# Patient Record
Sex: Male | Born: 1975 | Race: White | Hispanic: No | Marital: Single | Smoking: Current every day smoker
Health system: Southern US, Community
[De-identification: ages and names within clinical notes are randomized; demographics above are authoritative.]

## PROBLEM LIST (undated history)

## (undated) DIAGNOSIS — K219 Gastro-esophageal reflux disease without esophagitis: Secondary | ICD-10-CM

## (undated) DIAGNOSIS — Z87442 Personal history of urinary calculi: Secondary | ICD-10-CM

## (undated) DIAGNOSIS — K8020 Calculus of gallbladder without cholecystitis without obstruction: Secondary | ICD-10-CM

## (undated) HISTORY — DX: Calculus of gallbladder without cholecystitis without obstruction: K80.20

## (undated) HISTORY — PX: WISDOM TOOTH EXTRACTION: SHX21

---

## 2000-12-05 ENCOUNTER — Emergency Department (HOSPITAL_COMMUNITY): Admission: EM | Admit: 2000-12-05 | Discharge: 2000-12-05 | Payer: Self-pay | Admitting: Emergency Medicine

## 2004-07-16 ENCOUNTER — Ambulatory Visit: Payer: Self-pay | Admitting: Internal Medicine

## 2013-02-09 ENCOUNTER — Encounter: Payer: Self-pay | Admitting: Family Medicine

## 2013-02-09 ENCOUNTER — Ambulatory Visit (INDEPENDENT_AMBULATORY_CARE_PROVIDER_SITE_OTHER): Payer: Self-pay | Admitting: Family Medicine

## 2013-02-09 VITALS — BP 138/96 | HR 64 | Temp 98.1°F | Wt 169.2 lb

## 2013-02-09 DIAGNOSIS — B9689 Other specified bacterial agents as the cause of diseases classified elsewhere: Secondary | ICD-10-CM | POA: Insufficient documentation

## 2013-02-09 DIAGNOSIS — J019 Acute sinusitis, unspecified: Secondary | ICD-10-CM

## 2013-02-09 MED ORDER — DOXYCYCLINE HYCLATE 100 MG PO CAPS: 100.0000 mg | ORAL_CAPSULE | Freq: Two times a day (BID) | ORAL | Status: DC

## 2013-02-09 NOTE — Patient Instructions (Addendum)
Let's keep an eye on blood pressure at local pharmacy - if persistently elevated > 140/90, return to see me. Cancel appointment to re establish up front. You have a sinus infection. Take medicine as prescribed: doxycycline 10d course Push fluids and plenty of rest. Ibuprofen will help sinus pain Nasal saline irrigation or neti pot to help drain sinuses. May use plain mucinex (or immediate release guaifenesin) with plenty of fluid to help mobilize mucous. Let us know if fever >101.5, trouble opening/closing mouth, difficulty swallowing, or worsening - you may need to be seen again.

## 2013-02-09 NOTE — Progress Notes (Signed)
  Subjective:    Patient ID: Skip Mayer, male    DOB: 1975/12/04, 37 y.o.   MRN: [id number]  HPI CC: "i think i have a sinus infection"  Left sinus pressure behind eye for the last 9 days, worsening over the last several days.  Blowing nose with green purulent mucous.  PNdrainage.  So far has tried ibuprofen for this.  Ex smoker - quit 01/31/2013.  No sick contacts at home. Smokers at home - smoke outside. No h/o asthma.  Not seen here in >5 yrs.  Medications and allergies reviewed and updated in chart.  Past histories reviewed and updated if relevant as below. There are no active problems to display for this patient.  History reviewed. No pertinent past medical history. History reviewed. No pertinent past surgical history. History  Substance Use Topics  . Smoking status: Former Games developer  . Smokeless tobacco: Never Used  . Alcohol Use: Yes     Comment: Occasional   Family History  Problem Relation Age of Onset  . Hyperlipidemia Father   . Heart disease Paternal Uncle     heart transplant  . Cancer Paternal Uncle     kidney  . Diabetes Neg Hx   . Stroke Neg Hx   . CAD Neg Hx    Allergies  Allergen Reactions  . Penicillins Anaphylaxis   No current outpatient prescriptions on file prior to visit.   No current facility-administered medications on file prior to visit.     Review of Systems No fevers/chills, abd pain, nausea, ear or tooth pain, coughing, ST.     Objective:   Physical Exam  Nursing note and vitals reviewed. Constitutional: He appears well-developed and well-nourished. No distress.  HENT:  Head: Normocephalic and atraumatic.  Right Ear: Hearing, tympanic membrane, external ear and ear canal normal.  Left Ear: Hearing, tympanic membrane, external ear and ear canal normal.  Nose: Mucosal edema present. No rhinorrhea. Right sinus exhibits no maxillary sinus tenderness and no frontal sinus tenderness. Left sinus exhibits maxillary sinus  tenderness and frontal sinus tenderness.  Mouth/Throat: Uvula is midline, oropharynx is clear and moist and mucous membranes are normal. No oropharyngeal exudate, posterior oropharyngeal edema, posterior oropharyngeal erythema or tonsillar abscesses.  Purulent nasal discharge out of left nare  Eyes: Conjunctivae and EOM are normal. Pupils are equal, round, and reactive to light. No scleral icterus.  Neck: Normal range of motion. Neck supple.  Cardiovascular: Normal rate, regular rhythm, normal heart sounds and intact distal pulses.   No murmur heard. Pulmonary/Chest: Effort normal and breath sounds normal. No respiratory distress. He has no wheezes. He has no rales.  Lymphadenopathy:    He has no cervical adenopathy.  Skin: Skin is warm and dry. No rash noted.       Assessment & Plan:

## 2013-02-09 NOTE — Assessment & Plan Note (Signed)
given duration and progression of sxs, anticipate bacterial. Treat with doxy 10 d course. Supportive care as per instructions. Update if sxs persist

## 2013-02-27 ENCOUNTER — Ambulatory Visit: Payer: Self-pay | Admitting: Family Medicine

## 2013-03-17 ENCOUNTER — Emergency Department: Payer: Self-pay | Admitting: Emergency Medicine

## 2013-03-17 LAB — URINALYSIS, COMPLETE
Glucose,UR: NEGATIVE mg/dL (ref 0–75)
Leukocyte Esterase: NEGATIVE
Ph: 5 (ref 4.5–8.0)
Protein: NEGATIVE
RBC,UR: 2 /HPF (ref 0–5)
Specific Gravity: 1.018 (ref 1.003–1.030)
Squamous Epithelial: NONE SEEN

## 2013-03-17 LAB — CBC
HCT: 45.7 % (ref 40.0–52.0)
HGB: 15.8 g/dL (ref 13.0–18.0)
Platelet: 319 10*3/uL (ref 150–440)
RDW: 13 % (ref 11.5–14.5)

## 2013-03-17 LAB — COMPREHENSIVE METABOLIC PANEL
Albumin: 4.6 g/dL (ref 3.4–5.0)
Anion Gap: 9 (ref 7–16)
Co2: 27 mmol/L (ref 21–32)
Creatinine: 1.43 mg/dL — ABNORMAL HIGH (ref 0.60–1.30)
EGFR (Non-African Amer.): >60
Osmolality: 283 (ref 275–301)
SGOT(AST): 30 U/L (ref 15–37)
Total Protein: 8.3 g/dL — ABNORMAL HIGH (ref 6.4–8.2)

## 2013-03-17 LAB — LIPASE, BLOOD: Lipase: 227 U/L (ref 73–393)

## 2013-07-19 ENCOUNTER — Other Ambulatory Visit: Payer: Self-pay

## 2018-04-10 ENCOUNTER — Emergency Department
Admission: EM | Admit: 2018-04-10 | Discharge: 2018-04-10 | Disposition: A | Discharge status: Home / Self Care | Payer: Self-pay | Attending: Emergency Medicine | Admitting: Emergency Medicine

## 2018-04-10 ENCOUNTER — Encounter: Payer: Self-pay | Admitting: Emergency Medicine

## 2018-04-10 ENCOUNTER — Emergency Department: Payer: Self-pay

## 2018-04-10 DIAGNOSIS — K8020 Calculus of gallbladder without cholecystitis without obstruction: Secondary | ICD-10-CM | POA: Insufficient documentation

## 2018-04-10 DIAGNOSIS — K8050 Calculus of bile duct without cholangitis or cholecystitis without obstruction: Secondary | ICD-10-CM

## 2018-04-10 DIAGNOSIS — Z87891 Personal history of nicotine dependence: Secondary | ICD-10-CM | POA: Insufficient documentation

## 2018-04-10 LAB — COMPREHENSIVE METABOLIC PANEL
ALK PHOS: 129 U/L — AB (ref 38–126)
ALT: 127 U/L — ABNORMAL HIGH (ref 0–44)
ANION GAP: 9 (ref 5–15)
AST: 82 U/L — ABNORMAL HIGH (ref 15–41)
Albumin: 4.4 g/dL (ref 3.5–5.0)
BUN: 18 mg/dL (ref 6–20)
CALCIUM: 9.4 mg/dL (ref 8.9–10.3)
CHLORIDE: 104 mmol/L (ref 98–111)
CO2: 27 mmol/L (ref 22–32)
Creatinine, Ser: 1.17 mg/dL (ref 0.61–1.24)
GFR calc non Af Amer: >60 mL/min (ref >60)
Glucose, Bld: 126 mg/dL — ABNORMAL HIGH (ref 70–99)
POTASSIUM: 5 mmol/L (ref 3.5–5.1)
SODIUM: 140 mmol/L (ref 135–145)
Total Bilirubin: 0.9 mg/dL (ref 0.3–1.2)
Total Protein: 8 g/dL (ref 6.5–8.1)

## 2018-04-10 LAB — URINALYSIS, COMPLETE (UACMP) WITH MICROSCOPIC
Bacteria, UA: NONE SEEN
Bilirubin Urine: NEGATIVE
GLUCOSE, UA: NEGATIVE mg/dL
Hgb urine dipstick: NEGATIVE
KETONES UR: NEGATIVE mg/dL
Leukocytes, UA: NEGATIVE
Nitrite: NEGATIVE
PH: 7 (ref 5.0–8.0)
Protein, ur: NEGATIVE mg/dL
SPECIFIC GRAVITY, URINE: 1.011 (ref 1.005–1.030)
Squamous Epithelial / LPF: NONE SEEN (ref 0–5)

## 2018-04-10 LAB — CBC
HEMATOCRIT: 42.8 % (ref 40.0–52.0)
HEMOGLOBIN: 15.4 g/dL (ref 13.0–18.0)
MCH: 34.4 pg — AB (ref 26.0–34.0)
MCHC: 35.9 g/dL (ref 32.0–36.0)
MCV: 95.9 fL (ref 80.0–100.0)
PLATELETS: 321 10*3/uL (ref 150–440)
RBC: 4.46 MIL/uL (ref 4.40–5.90)
RDW: 12.9 % (ref 11.5–14.5)
WBC: 9 10*3/uL (ref 3.8–10.6)

## 2018-04-10 LAB — TROPONIN I: Troponin I: <0.03 ng/mL (ref <0.03)

## 2018-04-10 LAB — LIPASE, BLOOD: LIPASE: 34 U/L (ref 11–51)

## 2018-04-10 MED ORDER — OXYCODONE-ACETAMINOPHEN 5-325 MG PO TABS: 1.0000 | ORAL_TABLET | ORAL | 0 refills | Status: DC | PRN

## 2018-04-10 MED ORDER — ONDANSETRON HCL 4 MG PO TABS: 4.0000 mg | ORAL_TABLET | Freq: Every day | ORAL | 0 refills | Status: DC | PRN

## 2018-04-10 NOTE — ED Triage Notes (Signed)
Pt arrives with complaints of upper abdominal & upper back pain.Pt states the pain feels like pressure/aching and is increased with palpation. Pain is intermittent and rates it at a 7 on 0-10 scale. Pt denies and n/v/d.

## 2018-04-10 NOTE — ED Triage Notes (Signed)
First Nurse Note: C/O upper abdominal pain radiating left toward mid back.  Patient is AAOx3.  Skin warm and dry.  Posture upright and relaxed. NAD

## 2018-04-10 NOTE — ED Provider Notes (Signed)
[facility] Emergency Department Provider Note ____________________________________________   First MD Initiated Contact with Patient 04/10/18 1227     (approximate)  I have reviewed the triage vital signs and the nursing notes.   HISTORY  Chief Complaint Abdominal Pain   HPI Walter Jefferson is a 42 y.o. male without any chronic medical conditions was presented to the emergency department today with upper abdominal as well as right upper abdominal pain over the past several weeks.  He says it is worse in the evening and feels like a cramp.  There is some radiation to the left side of his back.  Denies any nausea or vomiting but says that it does worsen after eating and with laying down at night.  Denies any diarrhea.  Denies abdominal surgeries.  Says that his father has had had of his gallbladder out.  Patient drinks but only occasionally.  History reviewed. No pertinent past medical history.  Patient Active Problem List   Diagnosis Date Noted  . Acute bacterial sinusitis 02/09/2013    History reviewed. No pertinent surgical history.  Prior to Admission medications   Medication Sig Start Date End Date Taking? Authorizing Provider  doxycycline (VIBRAMYCIN) 100 MG capsule Take 1 capsule (100 mg total) by mouth 2 (two) times daily. 02/09/13   Ria Bush, MD    Allergies Penicillins  Family History  Problem Relation Age of Onset  . Hyperlipidemia Father   . Heart disease Paternal Uncle        heart transplant  . Cancer Paternal Uncle        kidney  . Diabetes Neg Hx   . Stroke Neg Hx   . CAD Neg Hx     Social History Social History   Tobacco Use  . Smoking status: Former Research scientist (life sciences)  . Smokeless tobacco: Never Used  Substance Use Topics  . Alcohol use: Yes    Comment: Occasional  . Drug use: No    Review of Systems  Constitutional: No fever/chills Eyes: No visual changes. ENT: No sore throat. Cardiovascular: Denies chest  pain. Respiratory: Denies shortness of breath. Gastrointestinal:no nausea, no vomiting.  No diarrhea.  No constipation. Genitourinary: Negative for dysuria. Musculoskeletal: As above Skin: Negative for rash. Neurological: Negative for headaches, focal weakness or numbness.   ____________________________________________   PHYSICAL EXAM:  VITAL SIGNS: ED Triage Vitals  Enc Vitals Group     BP 04/10/18 1215 (!) 141/86     Pulse Rate 04/10/18 1215 91     Resp 04/10/18 1215 10     Temp 04/10/18 1215 98.2 F (36.8 C)     Temp Source 04/10/18 1215 Oral     SpO2 04/10/18 1215 100 %     Weight 04/10/18 1216 180 lb (81.6 kg)     Height 04/10/18 1216 5\' 10"  (1.778 m)     Head Circumference --      Peak Flow --      Pain Score 04/10/18 1215 7     Pain Loc --      Pain Edu? --      Excl. in [place]? --     Constitutional: Alert and oriented. Well appearing and in no acute distress. Eyes: Conjunctivae are normal.  Head: Atraumatic. Nose: No congestion/rhinnorhea. Mouth/Throat: Mucous membranes are moist.  Neck: No stridor.   Cardiovascular: Normal rate, regular rhythm. Grossly normal heart sounds.   Respiratory: Normal respiratory effort.  No retractions. Lungs CTAB. Gastrointestinal: Soft with epigastric as well as right upper  quadrant tenderness palpation with a negative Murphy sign. No distention. No CVA tenderness. Musculoskeletal: No lower extremity tenderness nor edema.  No joint effusions. Neurologic:  Normal speech and language. No gross focal neurologic deficits are appreciated. Skin:  Skin is warm, dry and intact. No rash noted. Psychiatric: Mood and affect are normal. Speech and behavior are normal.  ____________________________________________   LABS (all labs ordered are listed, but only abnormal results are displayed)  Labs Reviewed  COMPREHENSIVE METABOLIC PANEL - Abnormal; Notable for the following components:      Result Value   Glucose, Bld 126 (*)    AST 82  (*)    ALT 127 (*)    Alkaline Phosphatase 129 (*)    All other components within normal limits  CBC - Abnormal; Notable for the following components:   MCH 34.4 (*)    All other components within normal limits  URINALYSIS, COMPLETE (UACMP) WITH MICROSCOPIC - Abnormal; Notable for the following components:   Color, Urine YELLOW (*)    APPearance CLEAR (*)    All other components within normal limits  LIPASE, BLOOD  TROPONIN I   ____________________________________________  EKG  ED ECG REPORT I, Doran Stabler, the attending physician, personally viewed and interpreted this ECG.   Date: 04/10/2018  EKG Time: 1214  Rate: 81  Rhythm: normal sinus rhythm  Axis: Normal  Intervals:none  ST&T Change: No ST segment elevation or depression.  No abnormal T wave inversion.  ____________________________________________  RADIOLOGY  2 cm of the gallbladder neck with mild wall thickening.  Minimal suggested pericholecystic fluid but no Murphy sign.  Cannot exclude acute cholecystitis. ____________________________________________   PROCEDURES  Procedure(s) performed:   Procedures  Critical Care performed:   ____________________________________________   INITIAL IMPRESSION / ASSESSMENT AND PLAN / ED COURSE  Pertinent labs & imaging results that were available during my care of the patient were reviewed by me and considered in my medical decision making (see chart for details).  Differential diagnosis includes, but is not limited to, biliary disease (biliary colic, acute cholecystitis, cholangitis, choledocholithiasis, etc), intrathoracic causes for epigastric abdominal pain including ACS, gastritis, duodenitis, pancreatitis, small bowel or large bowel obstruction, abdominal aortic aneurysm, hernia, and ulcer(s). As part of my medical decision making, I reviewed the following data within the [place] outpatient  records  ----------------------------------------- 1:47 PM on 04/10/2018 -----------------------------------------  I discussed the case with Dr. Burt Knack of surgery who will see the patient tomorrow morning at 9 AM in his office.  I discussed this plan with the patient who is understanding and willing to comply.  The patient has not vomited.  Reassuring lab work with a normal white blood cell count.  Patient to return for any worsening or concerning symptoms.  Will be discharged with Percocet as well as Zofran.  We also discussed the liver labs.  Patient says that he used to be a heavy drinker.  This may be the cause.  To be trended over time.  I do not believe this needs a further acute work-up. ____________________________________________   FINAL CLINICAL IMPRESSION(S) / ED DIAGNOSES   Cholelithiasis.  Biliary colic.   NEW MEDICATIONS STARTED DURING THIS VISIT:  New Prescriptions   No medications on file     Note:  This document was prepared using Dragon voice recognition software and may include unintentional dictation errors.     Orbie Pyo, MD 04/10/18 [phone]

## 2018-04-10 NOTE — ED Notes (Signed)
Pt reports upper abdominal pain intermittently for the last 3 weeks. Denies N/V/D. Pain is worse at night after eating. Reports L flank pain as well. Denies CP/SOB. VSS.

## 2018-04-11 ENCOUNTER — Inpatient Hospital Stay: Payer: Self-pay | Admitting: Surgery

## 2018-04-13 ENCOUNTER — Ambulatory Visit (INDEPENDENT_AMBULATORY_CARE_PROVIDER_SITE_OTHER): Payer: Self-pay | Admitting: Surgery

## 2018-04-13 ENCOUNTER — Encounter: Payer: Self-pay | Admitting: Surgery

## 2018-04-13 VITALS — BP 138/107 | HR 109 | Temp 98.7°F | Ht 70.0 in | Wt 178.0 lb

## 2018-04-13 DIAGNOSIS — K8050 Calculus of bile duct without cholangitis or cholecystitis without obstruction: Secondary | ICD-10-CM

## 2018-04-13 NOTE — Patient Instructions (Signed)
You have requested to have your Gallbladder removed. We will arrange this to be done on 04/19/2018 at [facility] by Dr. Burt Knack.   You will be off from work for approximately 1-2 weeks depending on your recovery.   Please avoid greasy and fried foods if at all possible prior to your scheduled surgery to decrease symptoms until then.  Please see the [facility]) pre-care form you have been given today.  If you have any questions or concerns please call our office.

## 2018-04-13 NOTE — Progress Notes (Signed)
Walter Jefferson is an 42 y.o. male.   Chief Complaint: Gallstones Consult requested by emergency room physician HPI: This a patient with gallstones.  He was in the ER the other day and was asked to come to the office at 9:00 on Tuesday and he showed up in our late when surgery was scheduled.  He was unable to be seen at that time.  He returns today.  He has had this for about a month and has nausea but no vomiting and ongoing pain related with fatty food intolerance.  He states that if he avoids fatty foods he does not have the pain is much.  He has a strong family history of gallbladder disease has not had any surgery.  He works in [employer] smokes tobacco does not drink much alcohol  History reviewed. No pertinent past medical history.  History reviewed. No pertinent surgical history.  Family History  Problem Relation Age of Onset  . Hyperlipidemia Father   . Heart disease Paternal Uncle        heart transplant  . Cancer Paternal Uncle        kidney  . Diabetes Neg Hx   . Stroke Neg Hx   . CAD Neg Hx    Social History:  reports that he has quit smoking. He has never used smokeless tobacco. He reports that he drinks alcohol. He reports that he does not use drugs.  Allergies:  Allergies  Allergen Reactions  . Penicillins Anaphylaxis     (Not in a hospital admission)   Review of Systems:   Review of Systems  Constitutional: Negative for chills and fever.  HENT: Negative.   Eyes: Negative.   Respiratory: Negative.   Cardiovascular: Negative.   Gastrointestinal: Positive for abdominal pain. Negative for constipation, diarrhea, heartburn, nausea and vomiting.  Genitourinary: Negative.   Musculoskeletal: Negative.   Skin: Negative.   Neurological: Negative.   Endo/Heme/Allergies: Negative.   Psychiatric/Behavioral: Negative.     Physical Exam:  Physical Exam  Constitutional: He is oriented to person, place, and time. He appears well-developed and  well-nourished. No distress.  HENT:  Head: Normocephalic and atraumatic.  Eyes: Pupils are equal, round, and reactive to light. EOM are normal. Right eye exhibits no discharge. Left eye exhibits no discharge. No scleral icterus.  Neck: Normal range of motion. Neck supple.  Cardiovascular: Normal rate and regular rhythm.  Pulmonary/Chest: Effort normal. No stridor. No respiratory distress. He has no wheezes.  Abdominal: Soft. He exhibits no distension. There is no tenderness. There is no guarding. A hernia is present.  Reducible nontender umbilical hernia.  Negative Murphy sign  Musculoskeletal: Normal range of motion. He exhibits no edema, tenderness or deformity.  Neurological: He is alert and oriented to person, place, and time.  Skin: Skin is warm and dry. He is not diaphoretic.  Vitals reviewed.   There were no vitals taken for this visit.    No results found for this or any previous visit (from the past 48 hour(s)). No results found.   Assessment/Plan Ultrasound shows stones.  Liver function tests were mildly elevated in the ED on the 29th. This is a patient with classic biliary colic symptoms with fatty food intolerance and right upper quadrant pain.  I recommended laparoscopic cholecystectomy for control of symptoms.  Because of his elevated liver function test we will repeat those and likely perform a cholangiogram as well.  He does have a small umbilical hernia that will need to be repaired at  the same time.  Preoperatively I have discussed with him the rationale for this operation and the options of observation and the risk of bleeding infection recurrence of symptoms recurrence of the hernia bile duct damage bile duct leak retained common bile duct stone in it which could require further surgery and/or ERCP stent papillotomy he understood and agreed to proceed.  Florene Glen, MD, FACS

## 2018-04-14 ENCOUNTER — Telehealth: Payer: Self-pay | Admitting: Surgery

## 2018-04-14 NOTE — Telephone Encounter (Signed)
Pt advised of pre op date/time and sx date. Sx: 04/19/18 with Dr Maeola Sarah cholecystectomy with gram and umbilical hernia repair.  Pre op: 04/17/18 between 1-5:00pm--phone interview.   Patient made aware to call [phone], between 1-3:00pm the day before surgery, to find out what time to arrive.

## 2018-04-17 ENCOUNTER — Other Ambulatory Visit: Payer: Self-pay

## 2018-04-17 ENCOUNTER — Encounter
Admission: RE | Admit: 2018-04-17 | Discharge: 2018-04-17 | Disposition: A | Discharge status: Home / Self Care | Payer: Self-pay | Source: Ambulatory Visit | Attending: Surgery | Admitting: Surgery

## 2018-04-17 HISTORY — DX: Personal history of urinary calculi: Z87.442

## 2018-04-17 HISTORY — DX: Gastro-esophageal reflux disease without esophagitis: K21.9

## 2018-04-17 NOTE — Patient Instructions (Signed)
Your procedure is scheduled on: 04-19-18 [facility] Report to Same Day Surgery 2nd floor medical mall [facility] Entrance-take elevator on left to 2nd floor.  Check in with surgery information desk.) To find out your arrival time please call [phone] between 1PM - 3PM on 04-18-18 TUESDAY  Remember: Instructions that are not followed completely may result in serious medical risk, up to and including death, or upon the discretion of your surgeon and anesthesiologist your surgery may need to be rescheduled.    _x___ 1. Do not eat food after midnight the night before your procedure. NO GUM OR CANDY AFTER MIDNIGHT.  You may drink clear liquids up to 2 hours before you are scheduled to arrive at the hospital for your procedure.  Do not drink clear liquids within 2 hours of your scheduled arrival to the hospital.  Clear liquids include  --Water or Apple juice without pulp  --Clear carbohydrate beverage such as ClearFast or Gatorade  --Black Coffee or Clear Tea (No milk, no creamers, do not add anything to the coffee or Tea   ____Ensure clear carbohydrate drink on the way to the hospital for bariatric patients  ____Ensure clear carbohydrate drink 3 hours before surgery for Dr Dwyane Luo patients if physician instructed.     __x__ 2. No Alcohol for 24 hours before or after surgery.   __x__3. No Smoking or e-cigarettes for 24 prior to surgery.  Do not use any chewable tobacco products for at least 6 hour prior to surgery   ____  4. Bring all medications with you on the day of surgery if instructed.    __x__ 5. Notify your doctor if there is any change in your medical condition     (cold, fever, infections).    x___6. On the morning of surgery brush your teeth with toothpaste and water.  You may rinse your mouth with mouth wash if you wish.  Do not swallow any toothpaste or mouthwash.   Do not wear jewelry, make-up, hairpins, clips or nail polish.  Do not wear lotions, powders, or perfumes.  You may wear deodorant.  Do not shave 48 hours prior to surgery. Men may shave face and neck.  Do not bring valuables to the hospital.    [facility] is not responsible for any belongings or valuables.               Contacts, dentures or bridgework may not be worn into surgery.  Leave your suitcase in the car. After surgery it may be brought to your room.  For patients admitted to the hospital, discharge time is determined by your treatment team.  _  Patients discharged the day of surgery will not be allowed to drive home.  You will need someone to drive you home and stay with you the night of your procedure.    Please read over the following fact sheets that you were given:   [facility] Preparing for Surgery and or MRSA Information   _x___ Take anti-hypertensive listed below, cardiac, seizure, asthma, anti-reflux and psychiatric medicines. These include:  1. YOU MAY TAKE PERCOCET DAY OF SURGERY IF NEEDED WITH A SMALL SIP OF WATER  2.  3.  4.  5.  6.  ____Fleets enema or Magnesium Citrate as directed.   _x___ Use CHG Soap or sage wipes as directed on instruction sheet   ____ Use inhalers on the day of surgery and bring to hospital day of surgery  ____ Stop Metformin and Janumet 2 days prior  to surgery.    ____ Take 1/2 of usual insulin dose the night before surgery and none on the morning surgery.   ____ Follow recommendations from Cardiologist, Pulmonologist or PCP regarding stopping Aspirin, Coumadin, Plavix ,Eliquis, Effient, or Pradaxa, and Pletal.  X____Stop Anti-inflammatories such as Advil, Aleve, Ibuprofen, Motrin, Naproxen, Naprosyn, Goodies powders or aspirin products NOW-OK to take Tylenol OR PERCOCET IF NEEDED   ____ Stop supplements until after surgery.     ____ Bring C-Pap to the hospital.

## 2018-04-18 ENCOUNTER — Encounter
Admission: RE | Admit: 2018-04-18 | Discharge: 2018-04-18 | Disposition: A | Discharge status: Home / Self Care | Payer: Self-pay | Source: Ambulatory Visit | Attending: Surgery | Admitting: Surgery

## 2018-04-18 DIAGNOSIS — Z01818 Encounter for other preprocedural examination: Secondary | ICD-10-CM | POA: Insufficient documentation

## 2018-04-18 DIAGNOSIS — K8050 Calculus of bile duct without cholangitis or cholecystitis without obstruction: Secondary | ICD-10-CM | POA: Insufficient documentation

## 2018-04-18 LAB — COMPREHENSIVE METABOLIC PANEL
ALBUMIN: 4.1 g/dL (ref 3.5–5.0)
ALT: 67 U/L — ABNORMAL HIGH (ref 0–44)
AST: 42 U/L — AB (ref 15–41)
Alkaline Phosphatase: 158 U/L — ABNORMAL HIGH (ref 38–126)
Anion gap: 6 (ref 5–15)
BUN: 18 mg/dL (ref 6–20)
CHLORIDE: 104 mmol/L (ref 98–111)
CO2: 29 mmol/L (ref 22–32)
CREATININE: 1.21 mg/dL (ref 0.61–1.24)
Calcium: 9.2 mg/dL (ref 8.9–10.3)
GFR calc Af Amer: >60 mL/min (ref >60)
GFR calc non Af Amer: >60 mL/min (ref >60)
GLUCOSE: 112 mg/dL — AB (ref 70–99)
Potassium: 4.4 mmol/L (ref 3.5–5.1)
SODIUM: 139 mmol/L (ref 135–145)
Total Bilirubin: 0.6 mg/dL (ref 0.3–1.2)
Total Protein: 7.6 g/dL (ref 6.5–8.1)

## 2018-04-18 LAB — CBC WITH DIFFERENTIAL/PLATELET
BASOS ABS: 0.1 10*3/uL (ref 0–0.1)
BASOS PCT: 1 %
EOS ABS: 0.4 10*3/uL (ref 0–0.7)
EOS PCT: 5 %
HCT: 44 % (ref 40.0–52.0)
Hemoglobin: 15.1 g/dL (ref 13.0–18.0)
Lymphocytes Relative: 33 %
Lymphs Abs: 2.6 10*3/uL (ref 1.0–3.6)
MCH: 32.8 pg (ref 26.0–34.0)
MCHC: 34.4 g/dL (ref 32.0–36.0)
MCV: 95.4 fL (ref 80.0–100.0)
Monocytes Absolute: 1 10*3/uL (ref 0.2–1.0)
Monocytes Relative: 12 %
Neutro Abs: 4 10*3/uL (ref 1.4–6.5)
Neutrophils Relative %: 49 %
PLATELETS: 400 10*3/uL (ref 150–440)
RBC: 4.61 MIL/uL (ref 4.40–5.90)
RDW: 13 % (ref 11.5–14.5)
WBC: 8.1 10*3/uL (ref 3.8–10.6)

## 2018-04-18 MED ORDER — CIPROFLOXACIN IN D5W 400 MG/200ML IV SOLN
400.0000 mg | INTRAVENOUS | Status: AC
  Administered 2018-04-19: 400 mg via INTRAVENOUS

## 2018-04-19 ENCOUNTER — Ambulatory Visit: Payer: Self-pay

## 2018-04-19 ENCOUNTER — Ambulatory Visit
Admission: RE | Admit: 2018-04-19 | Discharge: 2018-04-19 | Disposition: A | Discharge status: Home / Self Care | Payer: Self-pay | Source: Ambulatory Visit | Attending: Surgery | Admitting: Surgery

## 2018-04-19 ENCOUNTER — Ambulatory Visit: Payer: Self-pay | Admitting: Anesthesiology

## 2018-04-19 ENCOUNTER — Other Ambulatory Visit: Payer: Self-pay

## 2018-04-19 ENCOUNTER — Encounter
Admission: RE | Disposition: A | Discharge status: Home / Self Care | Payer: Self-pay | Source: Ambulatory Visit | Attending: Surgery

## 2018-04-19 DIAGNOSIS — K429 Umbilical hernia without obstruction or gangrene: Secondary | ICD-10-CM

## 2018-04-19 DIAGNOSIS — Z87891 Personal history of nicotine dependence: Secondary | ICD-10-CM | POA: Insufficient documentation

## 2018-04-19 DIAGNOSIS — K8066 Calculus of gallbladder and bile duct with acute and chronic cholecystitis without obstruction: Secondary | ICD-10-CM | POA: Insufficient documentation

## 2018-04-19 DIAGNOSIS — K8050 Calculus of bile duct without cholangitis or cholecystitis without obstruction: Secondary | ICD-10-CM

## 2018-04-19 HISTORY — PX: CHOLECYSTECTOMY: SHX55

## 2018-04-19 HISTORY — PX: UMBILICAL HERNIA REPAIR: SHX196

## 2018-04-19 SURGERY — LAPAROSCOPIC CHOLECYSTECTOMY WITH INTRAOPERATIVE CHOLANGIOGRAM
Anesthesia: General | Wound class: Clean Contaminated

## 2018-04-19 MED ORDER — FENTANYL CITRATE (PF) 100 MCG/2ML IJ SOLN
INTRAMUSCULAR | Status: DC | PRN
  Administered 2018-04-19: 50 ug via INTRAVENOUS
  Administered 2018-04-19: 100 ug via INTRAVENOUS

## 2018-04-19 MED ORDER — HEPARIN SODIUM (PORCINE) 5000 UNIT/ML IJ SOLN
INTRAMUSCULAR | Status: AC
  Administered 2018-04-19: 5000 [IU] via SUBCUTANEOUS
  Filled 2018-04-19: qty 1

## 2018-04-19 MED ORDER — OXYCODONE-ACETAMINOPHEN 5-325 MG PO TABS: 1.0000 | ORAL_TABLET | Freq: Four times a day (QID) | ORAL | 0 refills | Status: DC | PRN

## 2018-04-19 MED ORDER — CHLORHEXIDINE GLUCONATE CLOTH 2 % EX PADS: 6.0000 | MEDICATED_PAD | Freq: Once | CUTANEOUS | Status: DC

## 2018-04-19 MED ORDER — PROPOFOL 10 MG/ML IV BOLUS
INTRAVENOUS | Status: DC | PRN
  Administered 2018-04-19: 180 mg via INTRAVENOUS

## 2018-04-19 MED ORDER — OXYCODONE HCL 5 MG/5ML PO SOLN: 5.0000 mg | Freq: Once | ORAL | Status: AC | PRN

## 2018-04-19 MED ORDER — DEXAMETHASONE SODIUM PHOSPHATE 10 MG/ML IJ SOLN
INTRAMUSCULAR | Status: AC
  Filled 2018-04-19: qty 1

## 2018-04-19 MED ORDER — ACETAMINOPHEN 10 MG/ML IV SOLN
INTRAVENOUS | Status: AC
  Filled 2018-04-19: qty 100

## 2018-04-19 MED ORDER — PROPOFOL 10 MG/ML IV BOLUS
INTRAVENOUS | Status: AC
  Filled 2018-04-19: qty 40

## 2018-04-19 MED ORDER — PROMETHAZINE HCL 25 MG/ML IJ SOLN: 6.2500 mg | INTRAMUSCULAR | Status: DC | PRN

## 2018-04-19 MED ORDER — FAMOTIDINE 20 MG PO TABS
20.0000 mg | ORAL_TABLET | Freq: Once | ORAL | Status: AC
  Administered 2018-04-19: 20 mg via ORAL

## 2018-04-19 MED ORDER — MIDAZOLAM HCL 2 MG/2ML IJ SOLN
INTRAMUSCULAR | Status: DC | PRN
  Administered 2018-04-19: 2 mg via INTRAVENOUS

## 2018-04-19 MED ORDER — MIDAZOLAM HCL 2 MG/2ML IJ SOLN
INTRAMUSCULAR | Status: AC
  Filled 2018-04-19: qty 2

## 2018-04-19 MED ORDER — ACETAMINOPHEN 10 MG/ML IV SOLN
INTRAVENOUS | Status: DC | PRN
  Administered 2018-04-19: 1000 mg via INTRAVENOUS

## 2018-04-19 MED ORDER — ROCURONIUM BROMIDE 50 MG/5ML IV SOLN
INTRAVENOUS | Status: AC
  Filled 2018-04-19: qty 1

## 2018-04-19 MED ORDER — FENTANYL CITRATE (PF) 100 MCG/2ML IJ SOLN
INTRAMUSCULAR | Status: AC
  Administered 2018-04-19: 25 ug via INTRAVENOUS
  Filled 2018-04-19: qty 2

## 2018-04-19 MED ORDER — LACTATED RINGERS IV SOLN
INTRAVENOUS | Status: DC
  Administered 2018-04-19: 08:00:00 via INTRAVENOUS

## 2018-04-19 MED ORDER — FAMOTIDINE 20 MG PO TABS
ORAL_TABLET | ORAL | Status: AC
  Administered 2018-04-19: 20 mg via ORAL
  Filled 2018-04-19: qty 1

## 2018-04-19 MED ORDER — ROCURONIUM BROMIDE 100 MG/10ML IV SOLN
INTRAVENOUS | Status: DC | PRN
  Administered 2018-04-19: 50 mg via INTRAVENOUS

## 2018-04-19 MED ORDER — ONDANSETRON HCL 4 MG/2ML IJ SOLN
INTRAMUSCULAR | Status: AC
  Filled 2018-04-19: qty 2

## 2018-04-19 MED ORDER — BUPIVACAINE-EPINEPHRINE (PF) 0.25% -1:200000 IJ SOLN
INTRAMUSCULAR | Status: AC
  Filled 2018-04-19: qty 30

## 2018-04-19 MED ORDER — KETOROLAC TROMETHAMINE 30 MG/ML IJ SOLN
INTRAMUSCULAR | Status: AC
  Filled 2018-04-19: qty 1

## 2018-04-19 MED ORDER — LIDOCAINE HCL (CARDIAC) PF 100 MG/5ML IV SOSY
PREFILLED_SYRINGE | INTRAVENOUS | Status: DC | PRN
  Administered 2018-04-19: 100 mg via INTRAVENOUS

## 2018-04-19 MED ORDER — GLYCOPYRROLATE 0.2 MG/ML IJ SOLN
INTRAMUSCULAR | Status: AC
  Filled 2018-04-19: qty 1

## 2018-04-19 MED ORDER — DEXAMETHASONE SODIUM PHOSPHATE 10 MG/ML IJ SOLN
INTRAMUSCULAR | Status: DC | PRN
  Administered 2018-04-19: 10 mg via INTRAVENOUS

## 2018-04-19 MED ORDER — OXYCODONE HCL 5 MG PO TABS
5.0000 mg | ORAL_TABLET | Freq: Once | ORAL | Status: AC | PRN
  Administered 2018-04-19: 5 mg via ORAL

## 2018-04-19 MED ORDER — IOTHALAMATE MEGLUMINE 60 % INJ SOLN
INTRAMUSCULAR | Status: DC | PRN
  Administered 2018-04-19: 14 mL

## 2018-04-19 MED ORDER — SUGAMMADEX SODIUM 500 MG/5ML IV SOLN
INTRAVENOUS | Status: AC
  Filled 2018-04-19: qty 5

## 2018-04-19 MED ORDER — FENTANYL CITRATE (PF) 100 MCG/2ML IJ SOLN
INTRAMUSCULAR | Status: AC
  Filled 2018-04-19: qty 2

## 2018-04-19 MED ORDER — PHENYLEPHRINE HCL 10 MG/ML IJ SOLN
INTRAMUSCULAR | Status: AC
  Filled 2018-04-19: qty 1

## 2018-04-19 MED ORDER — PHENYLEPHRINE HCL 10 MG/ML IJ SOLN
INTRAMUSCULAR | Status: DC | PRN
  Administered 2018-04-19: 100 ug via INTRAVENOUS

## 2018-04-19 MED ORDER — OXYCODONE HCL 5 MG PO TABS
ORAL_TABLET | ORAL | Status: AC
  Administered 2018-04-19: 5 mg via ORAL
  Filled 2018-04-19: qty 1

## 2018-04-19 MED ORDER — BUPIVACAINE-EPINEPHRINE (PF) 0.25% -1:200000 IJ SOLN
INTRAMUSCULAR | Status: DC | PRN
  Administered 2018-04-19: 30 mL

## 2018-04-19 MED ORDER — CIPROFLOXACIN IN D5W 400 MG/200ML IV SOLN
INTRAVENOUS | Status: AC
  Filled 2018-04-19: qty 200

## 2018-04-19 MED ORDER — MEPERIDINE HCL 50 MG/ML IJ SOLN: 6.2500 mg | INTRAMUSCULAR | Status: DC | PRN

## 2018-04-19 MED ORDER — EPHEDRINE SULFATE 50 MG/ML IJ SOLN
INTRAMUSCULAR | Status: AC
  Filled 2018-04-19: qty 1

## 2018-04-19 MED ORDER — FENTANYL CITRATE (PF) 100 MCG/2ML IJ SOLN
25.0000 ug | INTRAMUSCULAR | Status: DC | PRN
Start: 2018-04-19 — End: 2018-04-19
  Administered 2018-04-19 (×4): 25 ug via INTRAVENOUS

## 2018-04-19 MED ORDER — LIDOCAINE HCL (PF) 2 % IJ SOLN
INTRAMUSCULAR | Status: AC
  Filled 2018-04-19: qty 10

## 2018-04-19 MED ORDER — KETOROLAC TROMETHAMINE 30 MG/ML IJ SOLN
INTRAMUSCULAR | Status: DC | PRN
  Administered 2018-04-19: 30 mg via INTRAVENOUS

## 2018-04-19 MED ORDER — HEPARIN SODIUM (PORCINE) 5000 UNIT/ML IJ SOLN
5000.0000 [IU] | Freq: Once | INTRAMUSCULAR | Status: AC
  Administered 2018-04-19: 5000 [IU] via SUBCUTANEOUS

## 2018-04-19 MED ORDER — SUGAMMADEX SODIUM 500 MG/5ML IV SOLN
INTRAVENOUS | Status: DC | PRN
  Administered 2018-04-19: 350 mg via INTRAVENOUS

## 2018-04-19 MED ORDER — ONDANSETRON HCL 4 MG/2ML IJ SOLN
INTRAMUSCULAR | Status: DC | PRN
  Administered 2018-04-19: 4 mg via INTRAVENOUS

## 2018-04-19 MED ORDER — SUCCINYLCHOLINE CHLORIDE 20 MG/ML IJ SOLN
INTRAMUSCULAR | Status: AC
  Filled 2018-04-19: qty 1

## 2018-04-19 SURGICAL SUPPLY — 55 items
ADH LQ OCL WTPRF AMP STRL LF (MISCELLANEOUS) ×1
ADHESIVE MASTISOL STRL (MISCELLANEOUS) ×3 IMPLANT
APPLIER CLIP ROT 10 11.4 M/L (STAPLE) ×3
APR CLP MED LRG 11.4X10 (STAPLE) ×1
BAG SPEC RTRVL LRG 6X4 10 (ENDOMECHANICALS) ×1
BLADE SURG SZ11 CARB STEEL (BLADE) ×3 IMPLANT
CANISTER SUCT 1200ML W/VALVE (MISCELLANEOUS) ×3 IMPLANT
CATH CHOLANGI 4FR 420404F (CATHETERS) ×2 IMPLANT
CHLORAPREP W/TINT 26ML (MISCELLANEOUS) ×3 IMPLANT
CLIP APPLIE ROT 10 11.4 M/L (STAPLE) ×1 IMPLANT
CLOSURE WOUND 1/2 X4 (GAUZE/BANDAGES/DRESSINGS) ×1
CONRAY 60ML FOR OR (MISCELLANEOUS) ×2 IMPLANT
DRAPE C-ARM XRAY 36X54 (DRAPES) ×2 IMPLANT
DRAPE LAPAROTOMY 100X77 ABD (DRAPES) ×3 IMPLANT
ELECT REM PT RETURN 9FT ADLT (ELECTROSURGICAL) ×3
ELECTRODE REM PT RTRN 9FT ADLT (ELECTROSURGICAL) ×1 IMPLANT
GLOVE BIO SURGEON STRL SZ8 (GLOVE) ×6 IMPLANT
GOWN STRL REUS W/ TWL LRG LVL3 (GOWN DISPOSABLE) ×4 IMPLANT
GOWN STRL REUS W/TWL LRG LVL3 (GOWN DISPOSABLE) ×12
IRRIGATION STRYKERFLOW (MISCELLANEOUS) ×1 IMPLANT
IRRIGATOR STRYKERFLOW (MISCELLANEOUS) ×3
IV CATH ANGIO 12GX3 LT BLUE (NEEDLE) ×3 IMPLANT
IV NS 1000ML (IV SOLUTION) ×3
IV NS 1000ML BAXH (IV SOLUTION) ×1 IMPLANT
JACKSON PRATT 10 (INSTRUMENTS) IMPLANT
KIT TURNOVER KIT A (KITS) ×3 IMPLANT
LABEL OR SOLS (LABEL) ×3 IMPLANT
NEEDLE HYPO 22GX1.5 SAFETY (NEEDLE) ×3 IMPLANT
NEEDLE VERESS 14GA 120MM (NEEDLE) ×3 IMPLANT
NS IRRIG 500ML POUR BTL (IV SOLUTION) ×3 IMPLANT
PACK BASIN MINOR ARMC (MISCELLANEOUS) ×3 IMPLANT
PACK LAP CHOLECYSTECTOMY (MISCELLANEOUS) ×3 IMPLANT
POUCH SPECIMEN RETRIEVAL 10MM (ENDOMECHANICALS) ×3 IMPLANT
SCISSORS METZENBAUM CVD 33 (INSTRUMENTS) ×3 IMPLANT
SLEEVE ENDOPATH XCEL 5M (ENDOMECHANICALS) ×6 IMPLANT
SPONGE GAUZE 2X2 8PLY STER LF (GAUZE/BANDAGES/DRESSINGS) ×4
SPONGE GAUZE 2X2 8PLY STRL LF (GAUZE/BANDAGES/DRESSINGS) ×8 IMPLANT
SPONGE LAP 18X18 RF (DISPOSABLE) ×3 IMPLANT
SPONGE VERSALON 4X4 4PLY (MISCELLANEOUS) IMPLANT
STRIP CLOSURE SKIN 1/2X4 (GAUZE/BANDAGES/DRESSINGS) ×2 IMPLANT
SUT ETHIBOND 0 (SUTURE) ×3 IMPLANT
SUT ETHIBOND NAB CT1 #1 30IN (SUTURE) ×3 IMPLANT
SUT MNCRL 4-0 (SUTURE) ×3
SUT MNCRL 4-0 27XMFL (SUTURE) ×1
SUT PROLENE 0 CT 1 30 (SUTURE) ×12 IMPLANT
SUT PROLENE 1 CT 1 30 (SUTURE) ×12 IMPLANT
SUT VIC AB 3-0 SH 27 (SUTURE) ×3
SUT VIC AB 3-0 SH 27X BRD (SUTURE) ×1 IMPLANT
SUT VICRYL 0 AB UR-6 (SUTURE) ×3 IMPLANT
SUTURE MNCRL 4-0 27XMF (SUTURE) ×1 IMPLANT
SYR 10ML LL (SYRINGE) ×3 IMPLANT
SYR 20CC LL (SYRINGE) ×3 IMPLANT
TROCAR XCEL NON-BLD 11X100MML (ENDOMECHANICALS) ×3 IMPLANT
TROCAR XCEL NON-BLD 5MMX100MML (ENDOMECHANICALS) ×3 IMPLANT
TUBING INSUFFLATION (TUBING) ×3 IMPLANT

## 2018-04-19 NOTE — Anesthesia Post-op Follow-up Note (Signed)
Anesthesia QCDR form completed.        

## 2018-04-19 NOTE — Anesthesia Postprocedure Evaluation (Signed)
Anesthesia Post Note  Patient: Walter Jefferson  Procedure(s) Performed: LAPAROSCOPIC CHOLECYSTECTOMY WITH INTRAOPERATIVE CHOLANGIOGRAM (N/A ) HERNIA REPAIR UMBILICAL ADULT (N/A )  Patient location during evaluation: PACU Anesthesia Type: General Level of consciousness: awake and alert and oriented Pain management: pain level controlled Vital Signs Assessment: post-procedure vital signs reviewed and stable Respiratory status: spontaneous breathing, nonlabored ventilation and respiratory function stable Cardiovascular status: blood pressure returned to baseline and stable Postop Assessment: no signs of nausea or vomiting Anesthetic complications: no     Last Vitals:  Vitals:   04/19/18 1045 04/19/18 1101  BP: 135/84 126/83  Pulse: 74 82  Resp: 16 16  Temp: (!) 36.2 C   SpO2: 97% 98%    Last Pain:  Vitals:   04/19/18 1045  TempSrc:   PainSc: 4                  Jasara Corrigan

## 2018-04-19 NOTE — Anesthesia Preprocedure Evaluation (Signed)
Anesthesia Evaluation  Patient identified by MRN, date of birth, ID band Patient awake    Reviewed: Allergy & Precautions, NPO status , Patient's Chart, lab work & pertinent test results  History of Anesthesia Complications Negative for: history of anesthetic complications  Airway Mallampati: II  TM Distance: >3 FB Neck ROM: Full    Dental  (+) Chipped, Poor Dentition   Pulmonary neg sleep apnea, neg COPD, Current Smoker,    breath sounds clear to auscultation- rhonchi (-) wheezing      Cardiovascular Exercise Tolerance: Good (-) hypertension(-) CAD, (-) Past MI, (-) Cardiac Stents and (-) CABG  Rhythm:Regular Rate:Normal - Systolic murmurs and - Diastolic murmurs    Neuro/Psych negative neurological ROS  negative psych ROS   GI/Hepatic Neg liver ROS, GERD  ,  Endo/Other  negative endocrine ROSneg diabetes  Renal/GU negative Renal ROS     Musculoskeletal negative musculoskeletal ROS (+)   Abdominal (+) - obese,   Peds  Hematology negative hematology ROS (+)   Anesthesia Other Findings Past Medical History: No date: Cholelithiasis No date: GERD (gastroesophageal reflux disease)     Comment:  rare-tums prn No date: History of kidney stones     Comment:  h/o   Reproductive/Obstetrics                             Anesthesia Physical Anesthesia Plan  ASA: II  Anesthesia Plan: General   Post-op Pain Management:    Induction: Intravenous  PONV Risk Score and Plan: 0 and Ondansetron and Midazolam  Airway Management Planned: Oral ETT  Additional Equipment:   Intra-op Plan:   Post-operative Plan: Extubation in OR  Informed Consent: I have reviewed the patients History and Physical, chart, labs and discussed the procedure including the risks, benefits and alternatives for the proposed anesthesia with the patient or authorized representative who has indicated his/her understanding  and acceptance.   Dental advisory given  Plan Discussed with: CRNA and Anesthesiologist  Anesthesia Plan Comments:         Anesthesia Quick Evaluation

## 2018-04-19 NOTE — Progress Notes (Signed)
Preoperative Review   Patient is met in the preoperative holding area. The history is reviewed in the chart and with the patient. I personally reviewed the options and rationale as well as the risks of this procedure that have been previously discussed with the patient. All questions asked by the patient and/or family were answered to their satisfaction.  Patient agrees to proceed with this procedure at this time.  Prabhjot Piscitello E Berl Bonfanti M.D. FACS  

## 2018-04-19 NOTE — OR Nursing (Signed)
Discharge instructions discussed with pt and dad. Dad and pt  Voice understanding.

## 2018-04-19 NOTE — Op Note (Signed)
Laparoscopic Cholecystectomy  Pre-operative Diagnosis: Biliary colic, umbilical hernia  Post-operative Diagnosis: Same Procedure: Laparoscopic cholecystectomy with C arm fluoroscopic cholangiography, umbilical herniorrhaphy  Surgeon: Jerrol Banana. Burt Knack, MD FACS  Anesthesia: Gen. with endotracheal tube  Assistant: PA student  Procedure Details  The patient was seen again in the Holding Room. The benefits, complications, treatment options, and expected outcomes were discussed with the patient. The risks of bleeding, infection, recurrence of symptoms, failure to resolve symptoms, bile duct damage, bile duct leak, retained common bile duct stone, bowel injury, any of which could require further surgery and/or ERCP, stent, or papillotomy were reviewed with the patient. The likelihood of improving the patient's symptoms with return to their baseline status is good.  The patient and/or family concurred with the proposed plan, giving informed consent.  The patient was taken to Operating Room, identified as Walter Jefferson and the procedure verified as Laparoscopic Cholecystectomy with cholangiography.  Patient also has an umbilical hernia that will be repaired concomitantly.  I discussed with him the rationale for this and the options of observation risk of bleeding infection cosmetic deformity and the potential for mesh placement which is unlikely in his situation.  A Time Out was held and the above information confirmed.  Prior to the induction of general anesthesia, antibiotic prophylaxis was administered. VTE prophylaxis was in place. General endotracheal anesthesia was then administered and tolerated well. After the induction, the abdomen was prepped with Chloraprep and draped in the sterile fashion. The patient was positioned in the supine position.  Local anesthetic  was injected into the skin near the umbilicus and an incision made.  Dissection down to the fascia was performed and the hernia  sac was opened and the fascial edges were cleaned.  It was noted to be subcentimeter in size.  Under direct vision figure-of-eight 0 Ethibonds were placed the lateral to were tied in the central one was not tied but at this point a 5 mm sheath was placed through the hernia into the abdominal cavity.  Pneumoperitoneum was then created with CO2 and tolerated well without any adverse changes in the patient's vital signs. The abdominal cavity was explored.  Two 5-mm ports were placed in the right upper quadrant and a 12 mm epigastric port was placed all under direct vision. All skin incisions  were infiltrated with a local anesthetic agent before making the incision and placing the trocars.   The patient was positioned  in reverse Trendelenburg, tilted slightly to the patient's left.  The gallbladder was identified, the fundus grasped and retracted cephalad. Adhesions were lysed bluntly. The infundibulum was grasped and retracted laterally, exposing the peritoneum overlying the triangle of Calot. This was then divided and exposed in a blunt fashion. A critical view of the cystic duct and cystic artery was obtained.  The cystic duct was clearly identified and bluntly dissected.   The cystic lymphatics and cystic artery were doubly clipped and divided.  This allowed for good visualization of the cystic duct as it entered a scar of fied infundibulum with a large stone impacted in the infundibulum.  Here the cystic duct was clipped and incised in through a separate incision and Angiocath a cholangiogram catheter was placed.  C-arm fluoroscopic cholangiography demonstrated good flow into the duodenum no intraluminal filling defects and the cystic duct had been cannulated.  Proximal ducts were well identified.  The cystic duct catheter was removed and the cystic duct was doubly clipped and divided.  The gallbladder was taken from the  gallbladder fossa in a retrograde fashion with the electrocautery.  There was a  lateral arterial branch which was clipped.  The gallbladder was removed and placed in an Endocatch bag. The liver bed was irrigated and inspected. Hemostasis was achieved with the electrocautery. Copious irrigation was utilized and was repeatedly aspirated until clear.  The gallbladder and Endocatch sac were then removed through the epigastric port site.   Inspection of the right upper quadrant was performed. No bleeding, bile duct injury or leak, or bowel injury was noted. Pneumoperitoneum was released.  The epigastric port site was closed with figure-of-eight 0 Vicryl sutures. 4-0 subcuticular Monocryl was used to close the skin.  The umbilical hernia site was closed with deep sutures of 3-0 Vicryl tacking the skin of the umbilicus back to the fascia.  Then 4-0 subcuticular Monocryl was used at the skin edges as well.  Steristrips and Mastisol and sterile dressings were  applied.  The patient was then extubated and brought to the recovery room in stable condition. Sponge, lap, and needle counts were correct at closure and at the conclusion of the case.   Findings: Chronic cholecystitis with normal cholangiogram.  Small umbilical hernia subcentimeter in nature repaired primarily.  Estimated Blood Loss: Nil         Drains: None         Specimens: Gallbladder           Complications: none               Kaelie Henigan E. Burt Knack, MD, FACS

## 2018-04-19 NOTE — Discharge Instructions (Signed)
Remove dressing in 24 hours. °May shower in 24 hours. °Leave paper strips in place. °Resume all home medications. °Follow-up with Dr. Cooper in 10 days. ° °AMBULATORY SURGERY  °DISCHARGE INSTRUCTIONS ° ° °1) The drugs that you were given will stay in your system until tomorrow so for the next 24 hours you should not: ° °A) Drive an automobile °B) Make any legal decisions °C) Drink any alcoholic beverage ° ° °2) You may resume regular meals tomorrow.  Today it is better to start with liquids and gradually work up to solid foods. ° °You may eat anything you prefer, but it is better to start with liquids, then soup and crackers, and gradually work up to solid foods. ° ° °3) Please notify your doctor immediately if you have any unusual bleeding, trouble breathing, redness and pain at the surgery site, drainage, fever, or pain not relieved by medication. ° ° ° °4) Additional Instructions: ° ° ° ° ° ° ° °Please contact your physician with any problems or Same Day Surgery at [phone], Monday through Friday 6 am to 4 pm, or [facility] [place] Main number at [phone]. °

## 2018-04-19 NOTE — Transfer of Care (Signed)
Immediate Anesthesia Transfer of Care Note  Patient: Walter Jefferson  Procedure(s) Performed: Procedure(s): LAPAROSCOPIC CHOLECYSTECTOMY WITH INTRAOPERATIVE CHOLANGIOGRAM (N/A) HERNIA REPAIR UMBILICAL ADULT (N/A)  Patient Location: PACU  Anesthesia Type:General  Level of Consciousness: sedated  Airway & Oxygen Therapy: Patient Spontanous Breathing and Patient connected to face mask oxygen  Post-op Assessment: Report given to RN and Post -op Vital signs reviewed and stable  Post vital signs: Reviewed and stable  Last Vitals:  Vitals:   04/19/18 0750 04/19/18 1002  BP: 133/90 (!) 155/85  Pulse: 87 70  Resp: 18 18  Temp: 36.8 C (!) 36.2 C  SpO2: 130% 865%    Complications: No apparent anesthesia complications

## 2018-04-19 NOTE — Anesthesia Procedure Notes (Signed)
Procedure Name: Intubation Date/Time: 04/19/2018 9:01 AM Performed by: Doreen Salvage, CRNA Pre-anesthesia Checklist: Patient identified, Patient being monitored, Timeout performed, Emergency Drugs available and Suction available Patient Re-evaluated:Patient Re-evaluated prior to induction Oxygen Delivery Method: Circle system utilized Preoxygenation: Pre-oxygenation with 100% oxygen Induction Type: IV induction Ventilation: Mask ventilation without difficulty Laryngoscope Size: Mac and 3 Grade View: Grade I Tube type: Oral Tube size: 7.5 mm Number of attempts: 1 Airway Equipment and Method: Stylet Placement Confirmation: ETT inserted through vocal cords under direct vision,  positive ETCO2 and breath sounds checked- equal and bilateral Secured at: 23 cm Tube secured with: Tape Dental Injury: Teeth and Oropharynx as per pre-operative assessment

## 2018-04-20 ENCOUNTER — Encounter: Payer: Self-pay | Admitting: Family Medicine

## 2018-04-21 LAB — SURGICAL PATHOLOGY

## 2018-05-01 ENCOUNTER — Encounter: Payer: Self-pay | Admitting: Surgery

## 2018-05-01 ENCOUNTER — Ambulatory Visit (INDEPENDENT_AMBULATORY_CARE_PROVIDER_SITE_OTHER): Payer: Self-pay | Admitting: Surgery

## 2018-05-01 VITALS — BP 127/81 | HR 83 | Temp 98.3°F | Wt 177.0 lb

## 2018-05-01 DIAGNOSIS — K8050 Calculus of bile duct without cholangitis or cholecystitis without obstruction: Secondary | ICD-10-CM

## 2018-05-01 NOTE — Patient Instructions (Signed)

## 2018-05-01 NOTE — Progress Notes (Signed)
Outpatient postop visit  05/01/2018  Walter Jefferson is an 42 y.o. male.    Procedure: Lap chole with cholangiograms and umbilical hernia repair  CC: No problems  HPI: Patient status post laparoscopic cholecystectomy with cholangiography for elevated liver function test and a concomitant umbilical herniorrhaphy.  Patient feels well is eating well no fevers chills no jaundice or acholic stools no nausea or vomiting  Medications reviewed.    Physical Exam:  There were no vitals taken for this visit.    PE: No jaundice no icterus abdomen is soft nontender wounds are clean no erythema no drainage.   Assessment/Plan:  Pathology is reviewed.  Patient doing very well reminded of no heavy lifting due to the hernia repair in the epigastric port site repair as well.  He understood and agreed to refrain from any heavy lifting for at least 2 weeks.  We will follow-up on an as-needed basis  Florene Glen, MD, FACS

## 2018-05-27 ENCOUNTER — Encounter: Payer: Self-pay | Admitting: Family Medicine

## 2018-10-22 IMAGING — US US ABDOMEN LIMITED
1 series · 14 of 25 positions shown · non-contrast
Comparison: None.

CLINICAL DATA: Right upper quadrant pain for 3 weeks

EXAM:
ULTRASOUND ABDOMEN LIMITED RIGHT UPPER QUADRANT

[Series 1: us abdomen limited · 0.22mm/px · 14 of 69 slices shown]
[im 1/69]
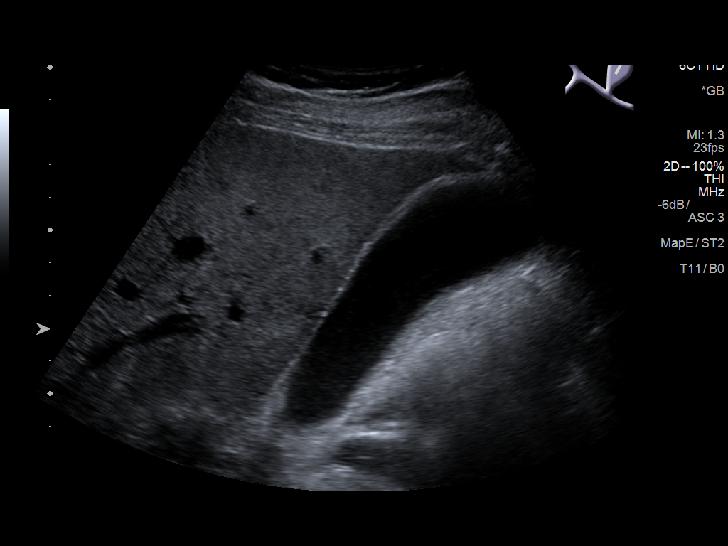
[im 6/69]
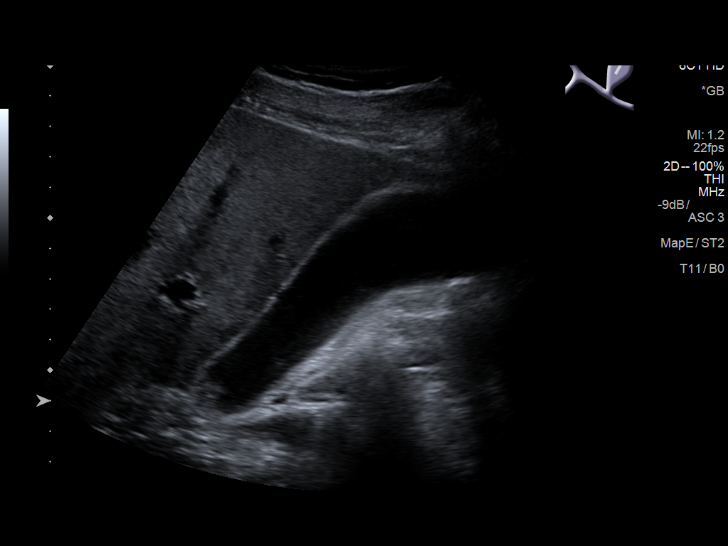
[im 12/69]
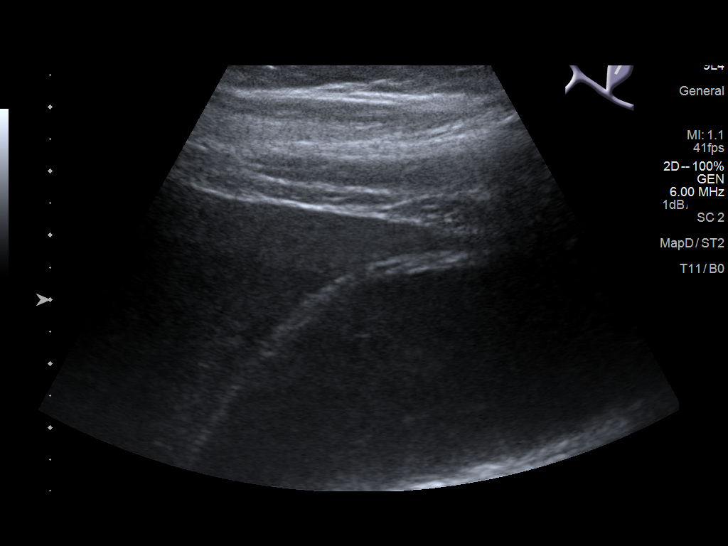
[im 18/69]
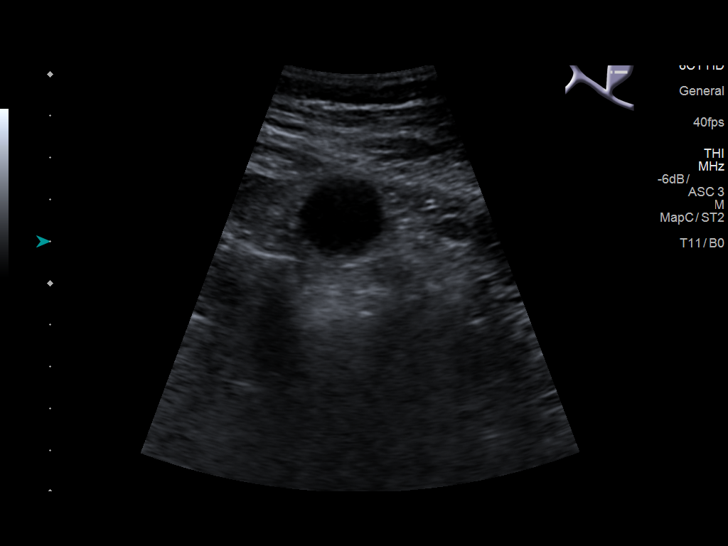
[im 23/69]
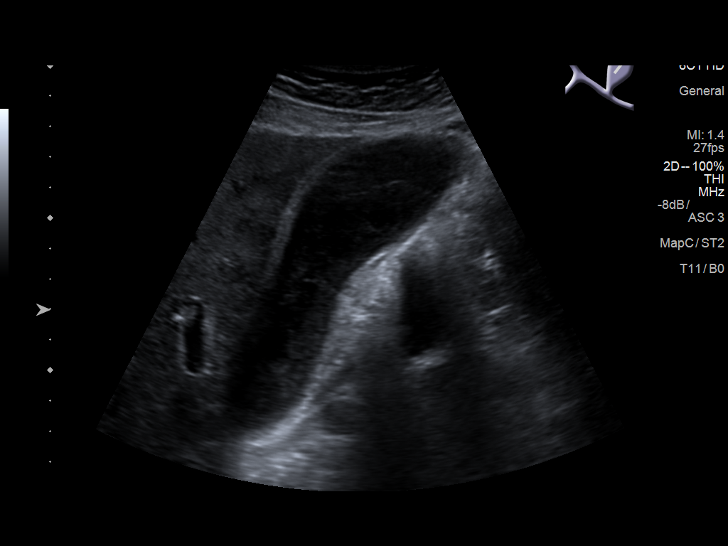
[im 26/69]
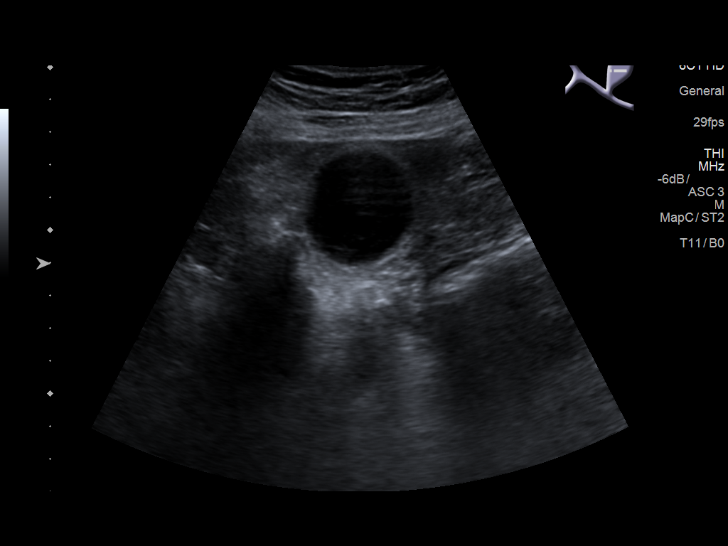
[im 32/69]
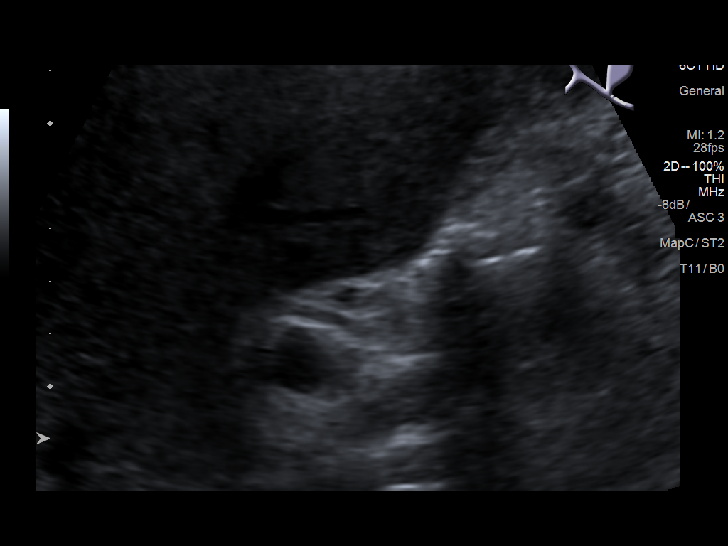
[im 37/69]
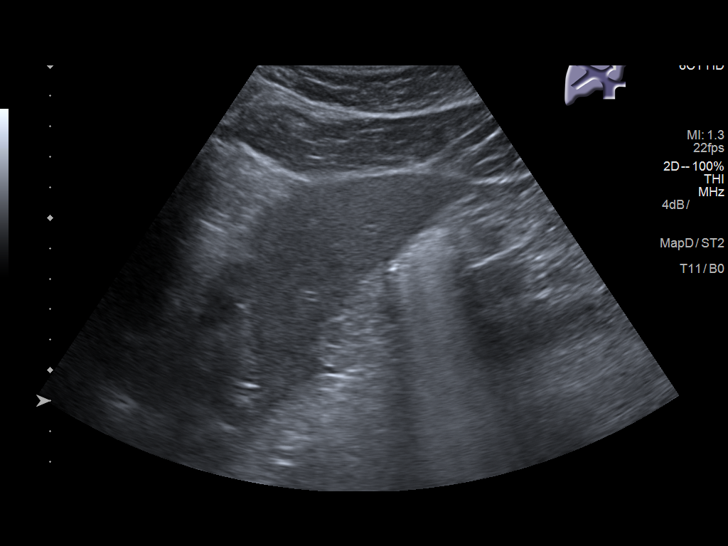
[im 43/69]
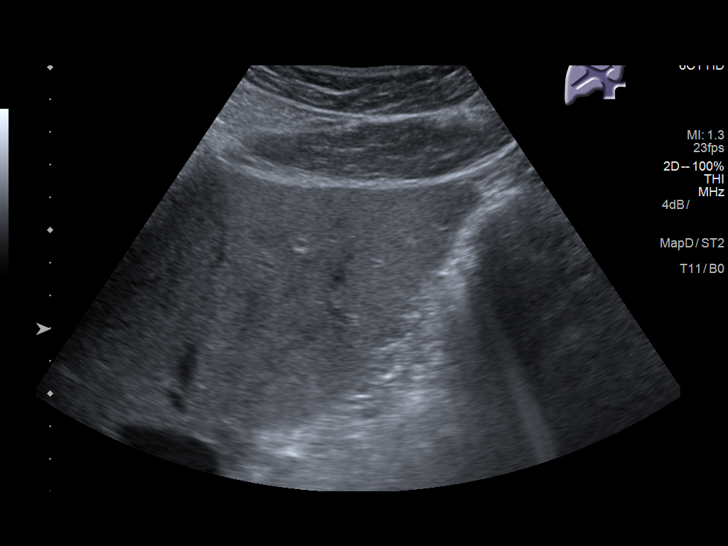
[im 46/69]
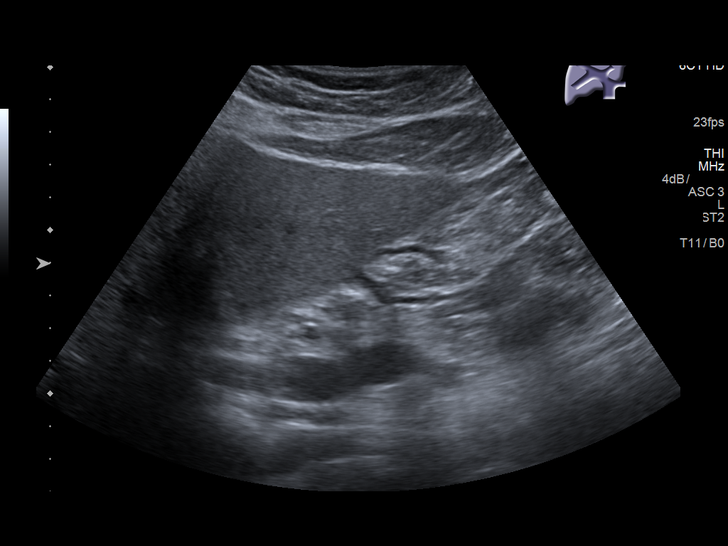
[im 52/69]
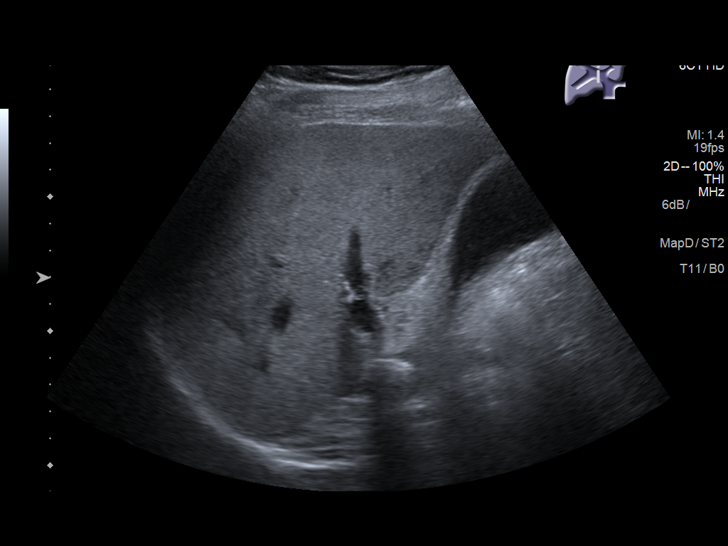
[im 57/69]
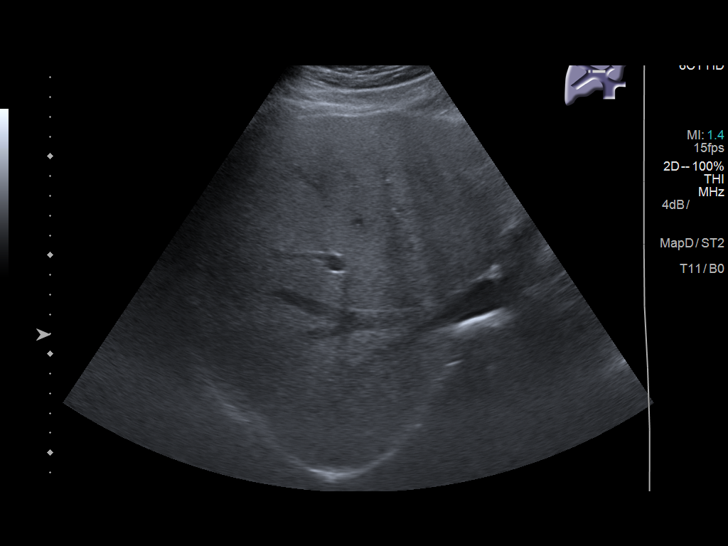
[im 63/69]
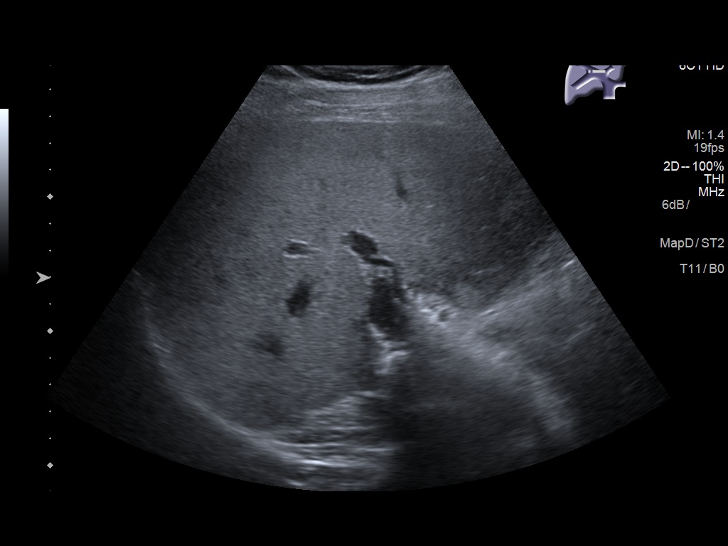
[im 69/69]
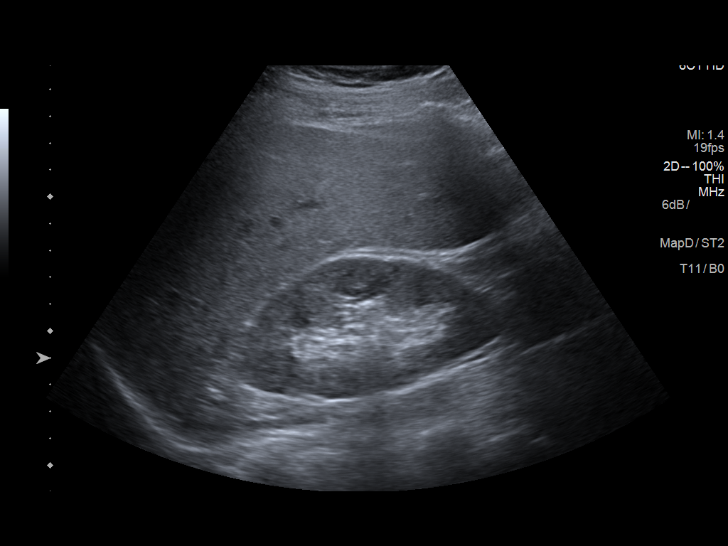

[14 of 25 positions shown; findings below may reference images not displayed]

FINDINGS: Gallbladder:

A 2 cm stone is seen in the gallbladder neck. Significant sludge
identified. The gallbladder wall measures 4 mm in thickness. No
Murphy's sign. Suggested minimal pericholecystic fluid.

Common bile duct:

Diameter: 2.1 mm

Liver:

Increased echogenicity diffusely suggesting hepatic steatosis.
Portal vein is patent on color Doppler imaging with normal direction
of blood flow towards the liver.
IMPRESSION: 1. There is a 2 cm stone in the gallbladder neck with mild wall
thickening, minimal suggested pericholecystic fluid, but no Murphy's
sign. Acute cholecystitis is not excluded with this study. If the
clinical picture remains ambiguous, recommend a HIDA scan for
further evaluation.
2. Hepatic steatosis.

## 2019-08-24 ENCOUNTER — Encounter: Payer: Self-pay | Admitting: Intensive Care

## 2019-08-24 ENCOUNTER — Emergency Department
Admission: EM | Admit: 2019-08-24 | Discharge: 2019-08-24 | Disposition: A | Discharge status: Home / Self Care | Payer: Self-pay | Attending: Emergency Medicine | Admitting: Emergency Medicine

## 2019-08-24 ENCOUNTER — Emergency Department: Payer: Self-pay

## 2019-08-24 ENCOUNTER — Other Ambulatory Visit: Payer: Self-pay

## 2019-08-24 DIAGNOSIS — Z79899 Other long term (current) drug therapy: Secondary | ICD-10-CM | POA: Insufficient documentation

## 2019-08-24 DIAGNOSIS — F17210 Nicotine dependence, cigarettes, uncomplicated: Secondary | ICD-10-CM | POA: Insufficient documentation

## 2019-08-24 DIAGNOSIS — N200 Calculus of kidney: Secondary | ICD-10-CM | POA: Insufficient documentation

## 2019-08-24 LAB — BASIC METABOLIC PANEL
Anion gap: 9 (ref 5–15)
BUN: 31 mg/dL — ABNORMAL HIGH (ref 6–20)
CO2: 24 mmol/L (ref 22–32)
Calcium: 9.5 mg/dL (ref 8.9–10.3)
Chloride: 105 mmol/L (ref 98–111)
Creatinine, Ser: 1.3 mg/dL — ABNORMAL HIGH (ref 0.61–1.24)
GFR calc Af Amer: >60 mL/min (ref >60)
GFR calc non Af Amer: >60 mL/min (ref >60)
Glucose, Bld: 96 mg/dL (ref 70–99)
Potassium: 4.8 mmol/L (ref 3.5–5.1)
Sodium: 138 mmol/L (ref 135–145)

## 2019-08-24 LAB — CBC
HCT: 43.1 % (ref 39.0–52.0)
Hemoglobin: 15.3 g/dL (ref 13.0–17.0)
MCH: 32.5 pg (ref 26.0–34.0)
MCHC: 35.5 g/dL (ref 30.0–36.0)
MCV: 91.5 fL (ref 80.0–100.0)
Platelets: 297 10*3/uL (ref 150–400)
RBC: 4.71 MIL/uL (ref 4.22–5.81)
RDW: 12.1 % (ref 11.5–15.5)
WBC: 7.6 10*3/uL (ref 4.0–10.5)
nRBC: 0 % (ref 0.0–0.2)

## 2019-08-24 LAB — URINALYSIS, COMPLETE (UACMP) WITH MICROSCOPIC
Bacteria, UA: NONE SEEN
Bilirubin Urine: NEGATIVE
Glucose, UA: NEGATIVE mg/dL
Ketones, ur: NEGATIVE mg/dL
Leukocytes,Ua: NEGATIVE
Nitrite: NEGATIVE
Protein, ur: NEGATIVE mg/dL
Specific Gravity, Urine: 1.013 (ref 1.005–1.030)
Squamous Epithelial / LPF: NONE SEEN (ref 0–5)
pH: 5 (ref 5.0–8.0)

## 2019-08-24 MED ORDER — OXYCODONE HCL 5 MG PO TABS
5.0000 mg | ORAL_TABLET | Freq: Once | ORAL | Status: AC
  Administered 2019-08-24: 5 mg via ORAL
  Filled 2019-08-24: qty 1

## 2019-08-24 MED ORDER — OXYCODONE HCL 5 MG PO TABS: 5.0000 mg | ORAL_TABLET | Freq: Three times a day (TID) | ORAL | 0 refills | Status: AC | PRN

## 2019-08-24 MED ORDER — TAMSULOSIN HCL 0.4 MG PO CAPS
0.4000 mg | ORAL_CAPSULE | Freq: Every day | ORAL | Status: DC
  Administered 2019-08-24: 0.4 mg via ORAL
  Filled 2019-08-24: qty 1

## 2019-08-24 MED ORDER — ONDANSETRON HCL 4 MG PO TABS
4.0000 mg | ORAL_TABLET | Freq: Once | ORAL | Status: AC
  Administered 2019-08-24: 4 mg via ORAL
  Filled 2019-08-24: qty 1

## 2019-08-24 MED ORDER — TAMSULOSIN HCL 0.4 MG PO CAPS: 0.4000 mg | ORAL_CAPSULE | Freq: Every day | ORAL | 0 refills | Status: AC

## 2019-08-24 MED ORDER — ONDANSETRON 4 MG PO TBDP: 4.0000 mg | ORAL_TABLET | Freq: Three times a day (TID) | ORAL | 0 refills | Status: AC | PRN

## 2019-08-24 NOTE — ED Notes (Signed)
Patient transported to CT 

## 2019-08-24 NOTE — ED Provider Notes (Signed)
[facility] Emergency Department Provider Note  ____________________________________________   First MD Initiated Contact with Patient 08/24/19 1242     (approximate)  I have reviewed the triage vital signs and the nursing notes.   HISTORY  Chief Complaint Flank Pain (bilateral)    HPI Kuba Andolina is a 43 y.o. male with history of kidney stones who comes in with flank pain.  Patient states that he initially had some increased urination that started yesterday that was moderate, intermittent, nothing made it better, nothing made it worse.  He then developed bilateral flank pain but now seems to be more left-sided flank pain that started last night. Pt states where the pain is changes on the way he sits.  Currently his pain is moderate.  Occasionally radiates down into his groin. Says this feels similar to prior kidney stone.  His previous kidney stone was able to pass on its own.  He denies any fevers.          Past Medical History:  Diagnosis Date  . Cholelithiasis   . GERD (gastroesophageal reflux disease)    rare-tums prn  . History of kidney stones    h/o    Patient Active Problem List   Diagnosis Date Noted  . Biliary colic   . Umbilical hernia without obstruction and without gangrene   . Acute bacterial sinusitis 02/09/2013    Past Surgical History:  Procedure Laterality Date  . CHOLECYSTECTOMY N/A 04/19/2018   Procedure: LAPAROSCOPIC CHOLECYSTECTOMY WITH INTRAOPERATIVE CHOLANGIOGRAM;  Surgeon: Florene Glen, MD;  Location: [facility] ORS;  Service: General;  Laterality: N/A;  . UMBILICAL HERNIA REPAIR N/A 04/19/2018   Procedure: HERNIA REPAIR UMBILICAL ADULT;  Surgeon: Florene Glen, MD;  Location: [facility] ORS;  Service: General;  Laterality: N/A;  . WISDOM TOOTH EXTRACTION      Prior to Admission medications   Medication Sig Start Date End Date Taking? Authorizing Provider  fluticasone (FLONASE) 50 MCG/ACT nasal spray Place 1-2  sprays into both nostrils daily as needed for allergies.    [provider]  ibuprofen (ADVIL,MOTRIN) 200 MG tablet Take 400 mg by mouth every 8 (eight) hours as needed (for pain.).    [provider]  loratadine (CLARITIN) 10 MG tablet Take 10 mg by mouth every morning.     [provider]  Multiple Vitamin (MULTIVITAMIN WITH MINERALS) TABS tablet Take 1 tablet by mouth daily.    [provider]  ondansetron (ZOFRAN) 4 MG tablet Take 4 mg by mouth daily as needed for nausea.    [provider]    Allergies Penicillins  Family History  Problem Relation Age of Onset  . Hyperlipidemia Father   . Heart disease Paternal Uncle        heart transplant  . Cancer Paternal Uncle        kidney  . Diabetes Neg Hx   . Stroke Neg Hx   . CAD Neg Hx     Social History Social History   Tobacco Use  . Smoking status: Current Every Day Smoker    Packs/day: 0.25    Years: 20.00    Pack years: 5.00    Types: Cigarettes  . Smokeless tobacco: Never Used  Substance Use Topics  . Alcohol use: Yes    Alcohol/week: 6.0 standard drinks    Types: 6 Cans of beer per week  . Drug use: No      Review of Systems Constitutional: No fever/chills Eyes: No visual  changes. ENT: No sore throat. Cardiovascular: Denies chest pain. Respiratory: Denies shortness of breath. Gastrointestinal: No abdominal pain.  No nausea, no vomiting.  No diarrhea.  No constipation. Genitourinary: Negative for dysuria.  Increased urination. Musculoskeletal: flank pain.  Skin: Negative for rash. Neurological: Negative for headaches, focal weakness or numbness. All other ROS negative ____________________________________________   PHYSICAL EXAM:  VITAL SIGNS: ED Triage Vitals  Enc Vitals Group     BP 08/24/19 1203 (!) 149/93     Pulse Rate 08/24/19 1203 90     Resp 08/24/19 1203 16     Temp 08/24/19 1203 97.6 F (36.4 C)     Temp Source 08/24/19 1203 Oral     SpO2  08/24/19 1203 100 %     Weight 08/24/19 1207 185 lb (83.9 kg)     Height 08/24/19 1207 5\' 10"  (1.778 m)     Head Circumference --      Peak Flow --      Pain Score 08/24/19 1207 5     Pain Loc --      Pain Edu? --      Excl. in [place]? --     Constitutional: Alert and oriented. Well appearing and in no acute distress. Eyes: Conjunctivae are normal. EOMI. Head: Atraumatic. Nose: No congestion/rhinnorhea. Mouth/Throat: Mucous membranes are moist.   Neck: No stridor. Trachea Midline. FROM Cardiovascular: Normal rate, regular rhythm. Grossly normal heart sounds.  Good peripheral circulation. Respiratory: Normal respiratory effort.  No retractions. Lungs CTAB. Gastrointestinal: Soft and nontender. No distention. No abdominal bruits.  Musculoskeletal: No lower extremity tenderness nor edema.  No joint effusions. Bilateral flank pain.  Neurologic:  Normal speech and language. No gross focal neurologic deficits are appreciated.  Skin:  Skin is warm, dry and intact. No rash noted. Psychiatric: Mood and affect are normal. Speech and behavior are normal. GU: Deferred   ____________________________________________   LABS (all labs ordered are listed, but only abnormal results are displayed)  Labs Reviewed  URINALYSIS, COMPLETE (UACMP) WITH MICROSCOPIC - Abnormal; Notable for the following components:      Result Value   Color, Urine STRAW (*)    APPearance CLEAR (*)    Hgb urine dipstick SMALL (*)    All other components within normal limits  BASIC METABOLIC PANEL - Abnormal; Notable for the following components:   BUN 31 (*)    Creatinine, Ser 1.30 (*)    All other components within normal limits  CBC   ____________________________________________   RADIOLOGY   Official radiology report(s): CT Renal Stone Study  Result Date: 08/24/2019 CLINICAL DATA:  Flank pain, kidney stone suspected. Additional history provided: Urinary frequency yesterday, now with right lower quadrant  pain, history of renal stones. EXAM: CT ABDOMEN AND PELVIS WITHOUT CONTRAST TECHNIQUE: Multidetector CT imaging of the abdomen and pelvis was performed following the standard protocol without IV contrast. COMPARISON:  Abdominal ultrasound 04/10/2018, CT abdomen/pelvis 03/17/2013. FINDINGS: LOWER CHEST: The imaged lungs are clear.The visualized heart is normal in size. HEPATOBILIARY: No evidence of focal liver lesion.The common duct is normal in caliber. Prior cholecystectomy. PANCREAS: No evidence of focal lesion.No ductal dilatation or peripancreatic edema. SPLEEN: No evidence of focal lesion.No splenomegaly. ADRENALS/URINARY TRACT: 1.5 cm left adrenal gland nodule with a mean Hounsfield unit measurement of 4 HU, findings consistent with adrenal adenoma. No right adrenal gland mass. There are multiple small calculi within the right renal collecting system measuring up to 3 mm. Several punctate calculi are also present within the left  renal collecting system. There is minimal asymmetric prominence of the right renal pelvis without overt hydronephrosis. Mild right hydroureter extending to the level of a 3 mm right UVJ calculus (series 2, image 77). The bladder is otherwise unremarkable for the degree of distension. STOMACH/BOWEL: The stomach is unremarkable.No dilated loops of bowel or bowel wall thickening.Normal appendix. VASCULAR/LYMPHATIC: No abdominal aortic aneurysm. Minimal scattered calcified plaque within the abdominal aorta. No lymphadenopathy. REPRODUCTIVE: Incompletely assessed by CT modality.No pathologic findings. OTHER: No ascites.The body wall is normal. MUSCULOSKELETAL: No acute bony abnormality. IMPRESSION: Mild right hydroureter extending to the level of a 3 mm right ureterovesicular junction calculus. Asymmetric prominence of the right renal pelvis without overt hydronephrosis. Additional small calculi within the bilateral kidneys. Incidentally noted 1.5 cm left adrenal adenoma. Electronically  Signed   By: Kellie Simmering DO   On: 08/24/2019 13:18    ____________________________________________   PROCEDURES  Procedure(s) performed (including Critical Care):  Procedures   ____________________________________________   INITIAL IMPRESSION / ASSESSMENT AND PLAN / ED COURSE  Malloy Colletta was evaluated in Emergency Department on 08/24/2019 for the symptoms described in the history of present illness. He was evaluated in the context of the global COVID-19 pandemic, which necessitated consideration that the patient might be at risk for infection with the SARS-CoV-2 virus that causes COVID-19. Institutional protocols and algorithms that pertain to the evaluation of patients at risk for COVID-19 are in a state of rapid change based on information released by regulatory bodies including the CDC and federal and state organizations. These policies and algorithms were followed during the patient's care in the ED.    Patient is a well-appearing 43 year old who comes in with left leg pain and increased urination.  This concerning for kidney stone.  Will get CT renal to evaluate the size and he shows no signs of obstruction.  Low suspicion for infection given patient is afebrile will get UA and CBC to further evaluate.  Will get labs to evaluate for AKI, electrolyte abnormalities.  No lower abdominal tenderness to suggest appendicitis, SBO.  Urine does have 21-50 RBCs which is concerning for the possibility of kidney stone.  His kidney function slightly elevated at 1.3.  However review of his past creatinines he count ranges between 1.17 and 1.43.  White count is normal making infection less likely.  Patient declines IV at this time and states that his pain is only a 5 out of 10 and that he cannot just get oral pain meds at this time.  He does have a ride home.   1:31 PM reevaluated patient pain is well controlled.  No vomiting.  No evidence of infected stone.  Patient does have a 3 mm  right UVJ calculus       ____________________________________________   FINAL CLINICAL IMPRESSION(S) / ED DIAGNOSES   Final diagnoses:  Kidney stone      MEDICATIONS GIVEN DURING THIS VISIT:  Medications  tamsulosin (FLOMAX) capsule 0.4 mg (0.4 mg Oral Given 08/24/19 1255)  oxyCODONE (Oxy IR/ROXICODONE) immediate release tablet 5 mg (5 mg Oral Given 08/24/19 1255)  ondansetron (ZOFRAN) tablet 4 mg (4 mg Oral Given 08/24/19 1255)     ED Discharge Orders         Ordered    ondansetron (ZOFRAN ODT) 4 MG disintegrating tablet  Every 8 hours PRN     08/24/19 1336    oxyCODONE (ROXICODONE) 5 MG immediate release tablet  Every 8 hours PRN     08/24/19 1336  tamsulosin (FLOMAX) 0.4 MG CAPS capsule  Daily     08/24/19 1336           Note:  This document was prepared using Dragon voice recognition software and may include unintentional dictation errors.   Vanessa Pinal, MD 08/24/19 1336

## 2019-08-24 NOTE — Discharge Instructions (Addendum)
You can take 1 g of Tylenol every 8 hours.  Take the oxycodone for breakthrough pain.  Take Zofran to help with nausea.  Take the Flomax to help dilate urethra.  Return for fevers, worsening pain, vomiting or any other concerns    IMPRESSION: Mild right hydroureter extending to the level of a 3 mm right ureterovesicular junction calculus. Asymmetric prominence of the right renal pelvis without overt hydronephrosis.   Additional small calculi within the bilateral kidneys.   Incidentally noted 1.5 cm left adrenal adenoma.

## 2019-08-24 NOTE — ED Triage Notes (Signed)
PAtient c/o bilateral flank pain since last night. Burning during urination with frequent trips

## 2019-08-25 ENCOUNTER — Encounter: Payer: Self-pay | Admitting: Family Medicine

## 2019-08-25 DIAGNOSIS — N200 Calculus of kidney: Secondary | ICD-10-CM | POA: Insufficient documentation

## 2019-08-25 DIAGNOSIS — D3502 Benign neoplasm of left adrenal gland: Secondary | ICD-10-CM | POA: Insufficient documentation

## 2019-09-04 IMAGING — CR DG CHOLANGIOGRAM OPERATIVE
3 series · 14 of 25 positions shown · non-contrast
Comparison: Right upper quadrant abdominal ultrasound - 04/10/2018

CLINICAL DATA: Intraoperative cholangiogram during laparoscopic
cholecystectomy.

EXAM:
INTRAOPERATIVE CHOLANGIOGRAM
FLUOROSCOPY TIME:  36 seconds

[Series 10: cont. · 5 of 9 frames shown (1 of 3)]
[frame 1/9]
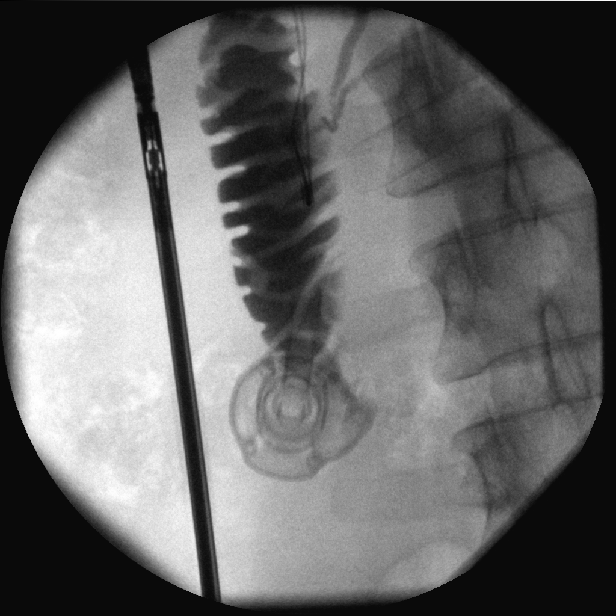
[frame 3/9]
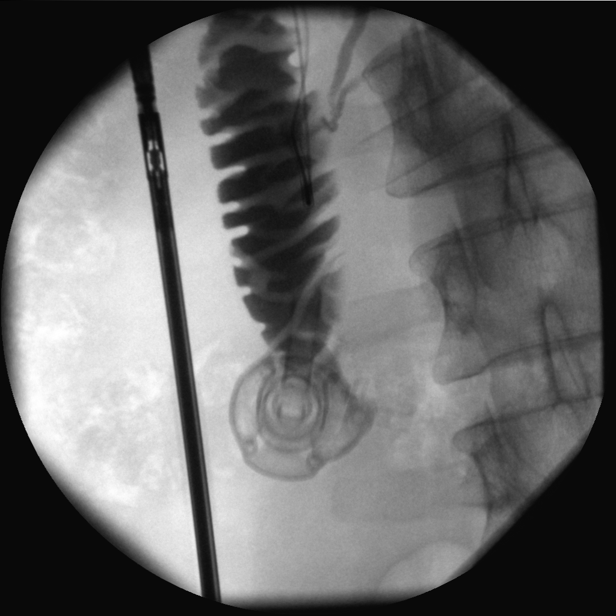
[frame 5/9]
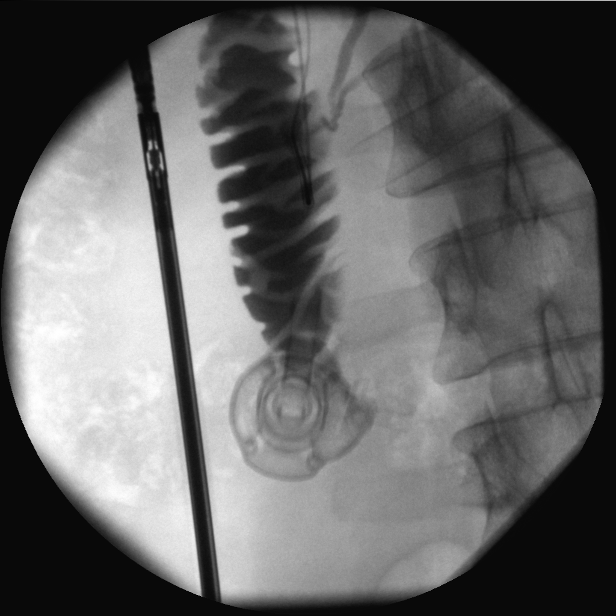
[frame 7/9]
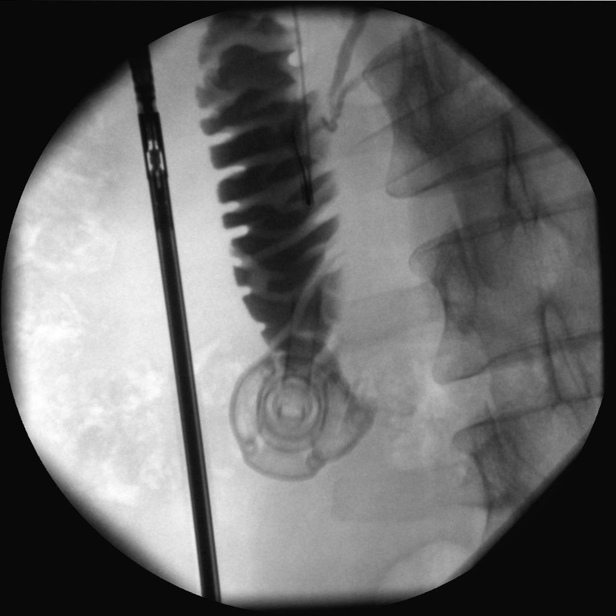
[frame 9/9]
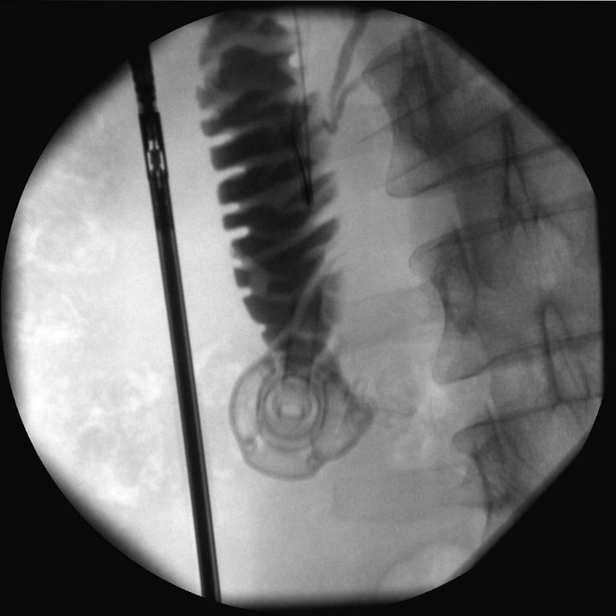

[Series 11: cont. · 3 of 6 frames shown (2 of 3)]
[frame 1/6]
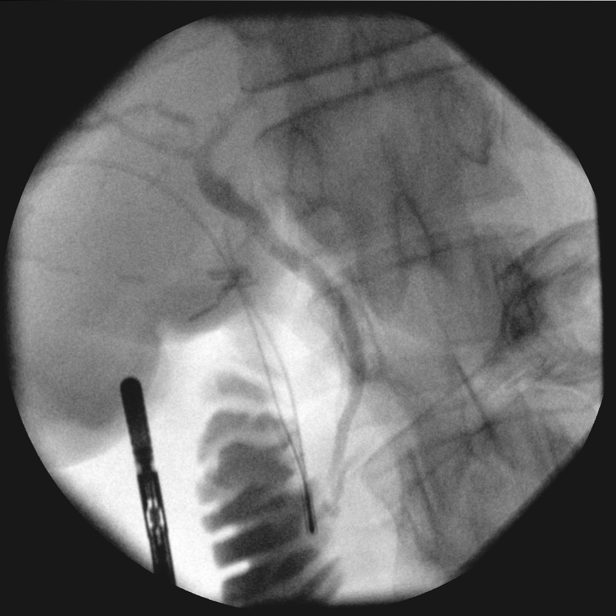
[frame 3/6]
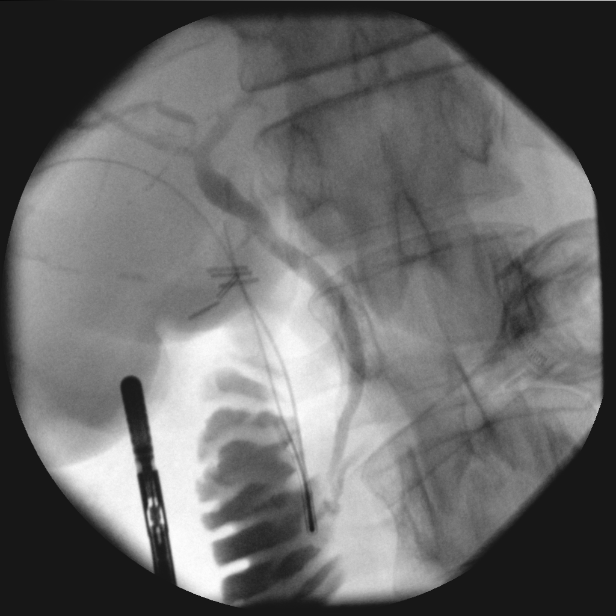
[frame 5/6]
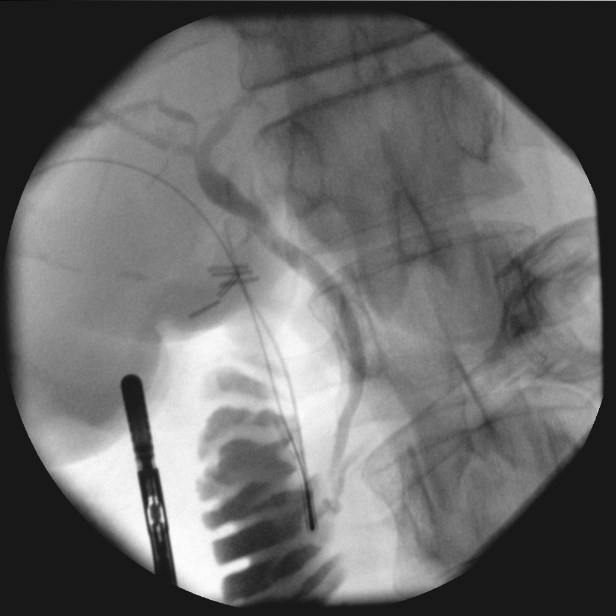

[Series 12: cont. · 6 of 27 frames shown (3 of 3)]
[frame 1/27]
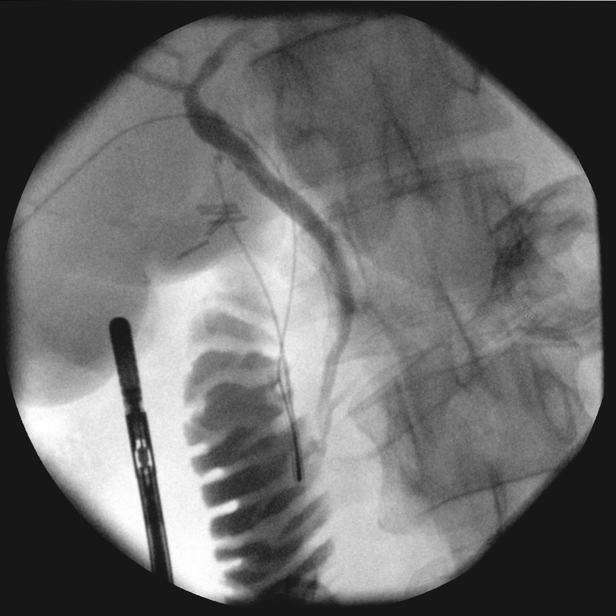
[frame 3/27]
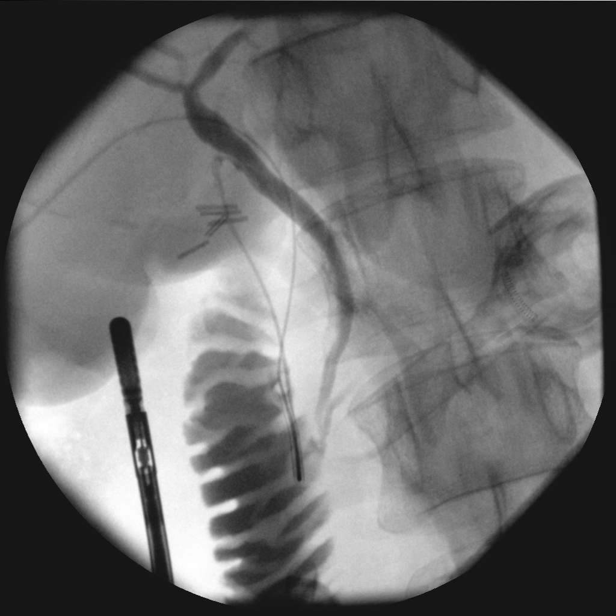
[frame 9/27]
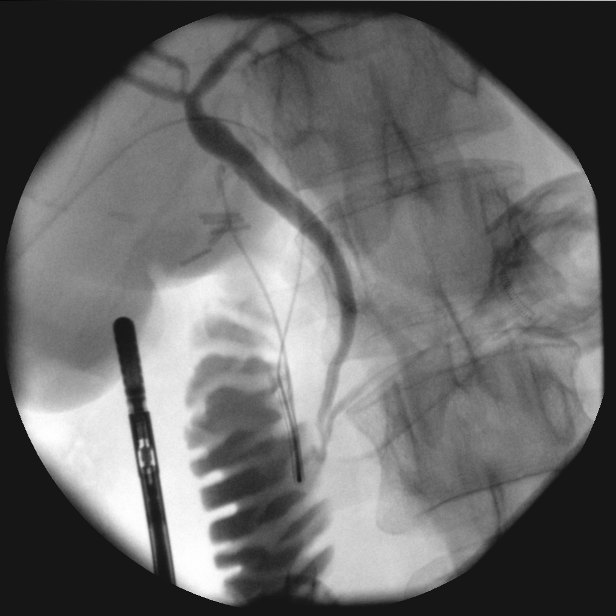
[frame 15/27]
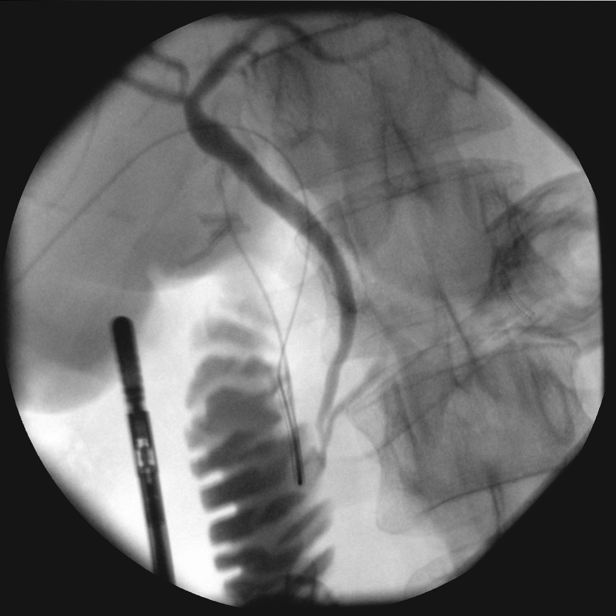
[frame 21/27]
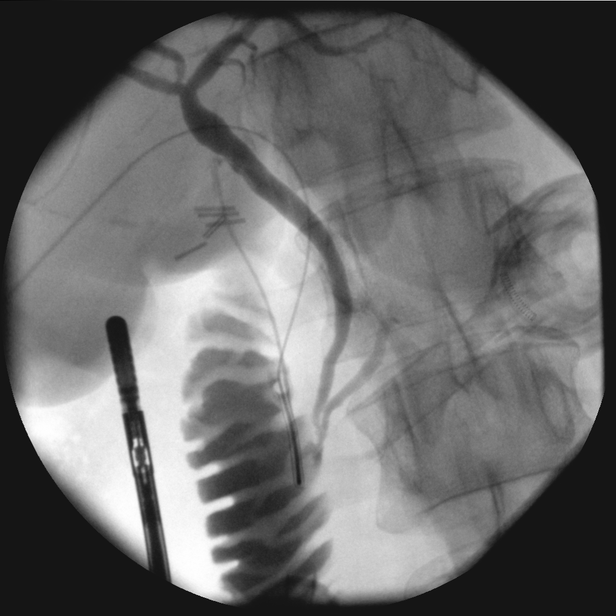
[frame 27/27]
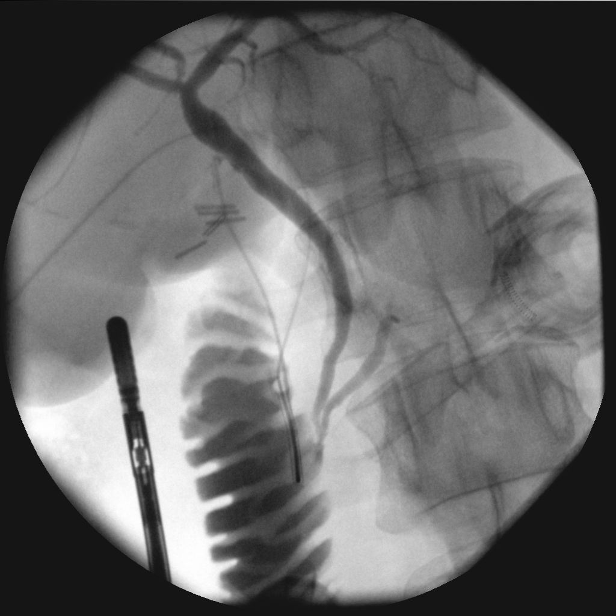

[14 of 25 positions shown; findings below may reference images not displayed]

FINDINGS: Intraoperative cholangiographic images of the right upper abdominal
quadrant during laparoscopic cholecystectomy are provided for
review.

Surgical clips overlie the expected location of the gallbladder
fossa.

Contrast injection demonstrates selective cannulation of the central
aspect of the cystic duct.

There is passage of contrast through the central aspect of the
cystic duct with filling of a non dilated common bile duct. There is
passage of contrast though the CBD and into the descending portion
of the duodenum.

There is minimal reflux of injected contrast into the common hepatic
duct and central aspect of the non dilated intrahepatic biliary
system. There is minimal opacification the central aspect the
pancreatic duct which appears nondilated.

There are no discrete filling defects within the opacified portions
of the biliary system to suggest the presence of
choledocholithiasis.
IMPRESSION: No evidence of choledocholithiasis.

## 2021-01-08 IMAGING — CT CT RENAL STONE PROTOCOL
2 of 4 series · 16 of 46 positions shown, 18 images · non-contrast
Comparison: Abdominal ultrasound 04/10/2018, CT abdomen/pelvis
03/17/2013.

CLINICAL DATA: Flank pain, kidney stone suspected. Additional
history provided: Urinary frequency yesterday, now with right lower
quadrant pain, history of renal stones.

EXAM:
CT ABDOMEN AND PELVIS WITHOUT CONTRAST
TECHNIQUE: Multidetector CT imaging of the abdomen and pelvis was performed
following the standard protocol without IV contrast.

[Series 2: stone full standard · axial · 0.69mm/px · z∈[-403,+27]mm · 13 of 94 slices shown, 15 images]
[im 4/94  soft-tissue]
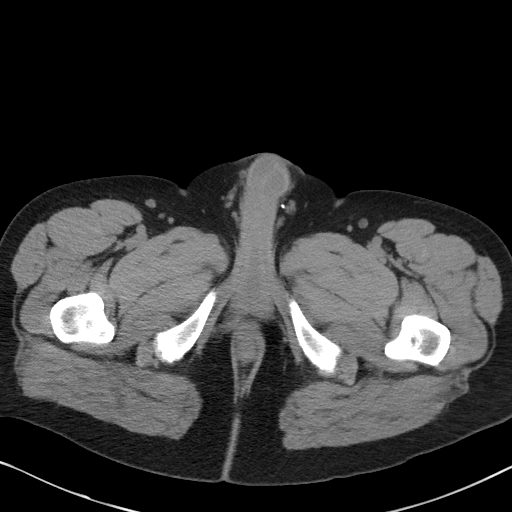
[im 4/94  bone]
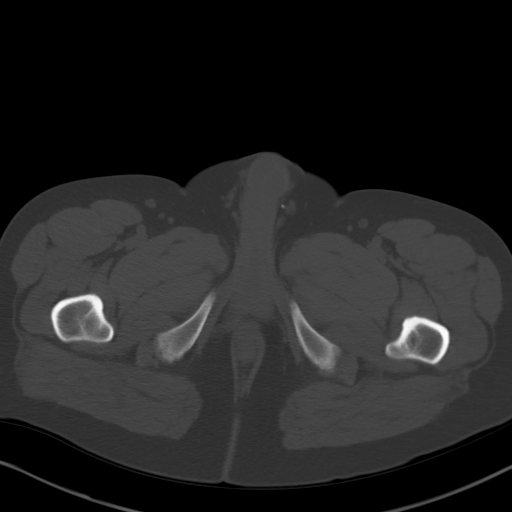
[im 12/94  soft-tissue]
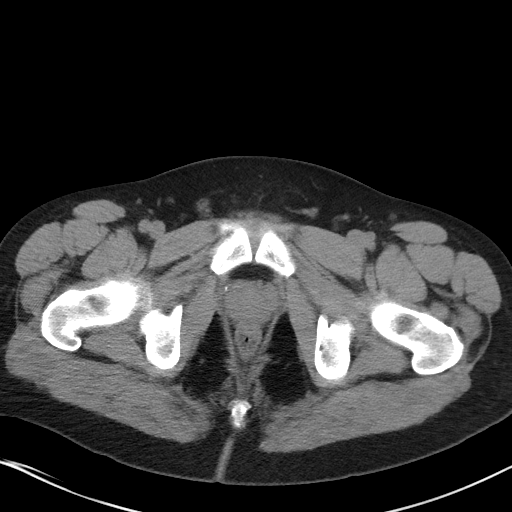
[im 20/94  soft-tissue]
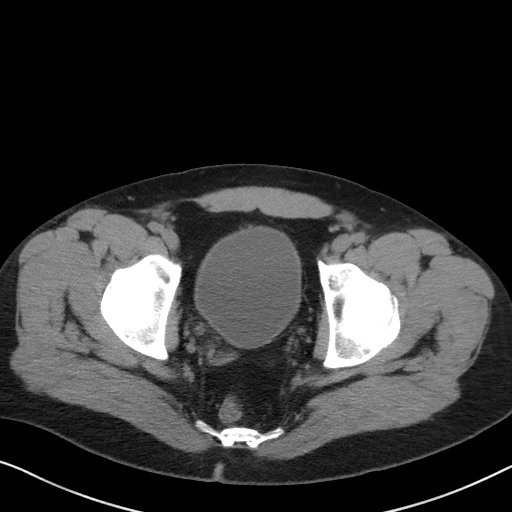
[im 28/94  soft-tissue]
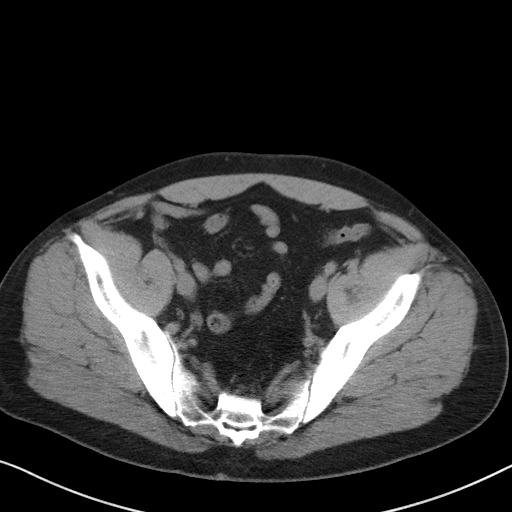
[im 32/94  soft-tissue]
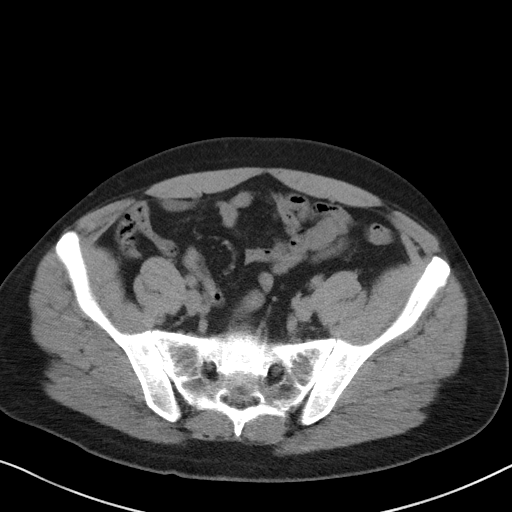
[im 39/94  soft-tissue]
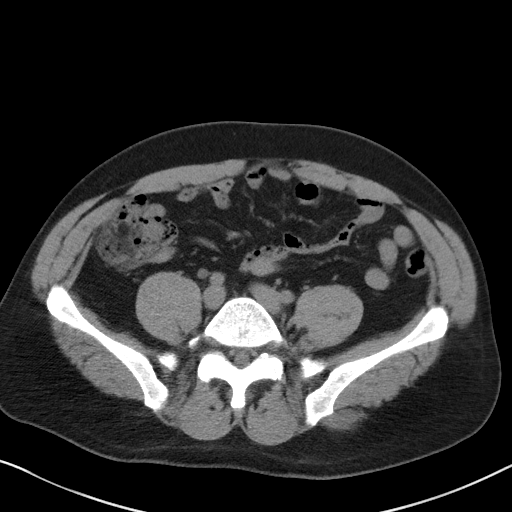
[im 47/94  soft-tissue]
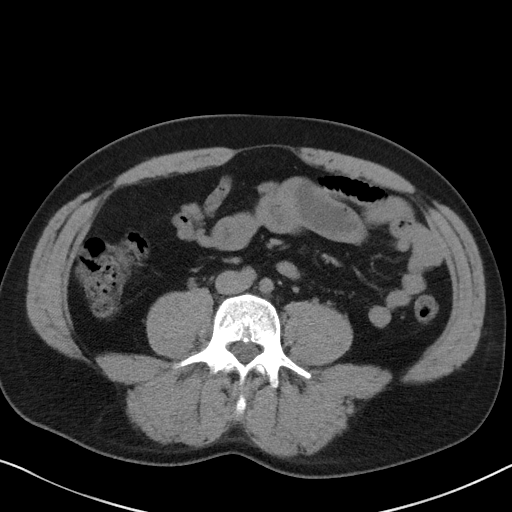
[im 55/94  soft-tissue]
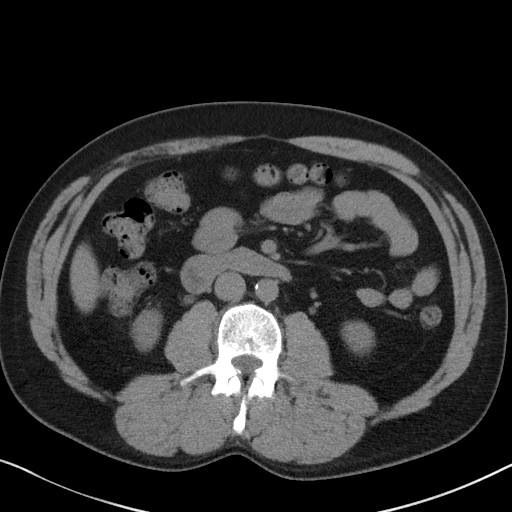
[im 63/94  soft-tissue]
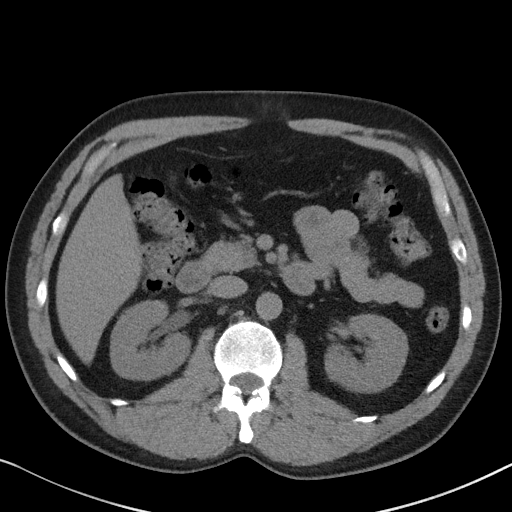
[im 63/94  bone]
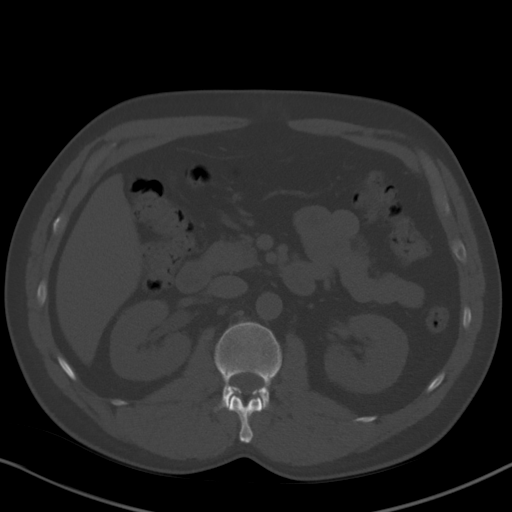
[im 66/94  soft-tissue]
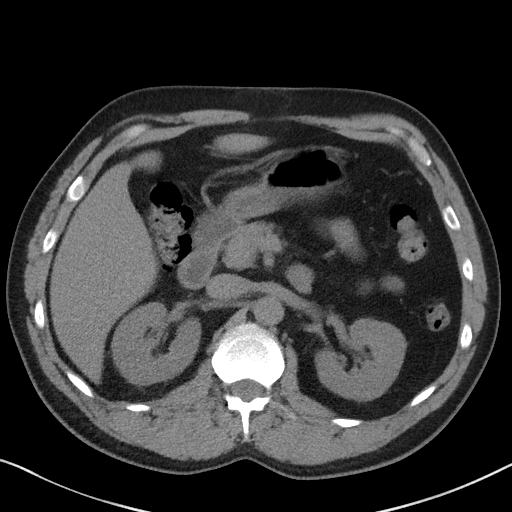
[im 74/94  soft-tissue]
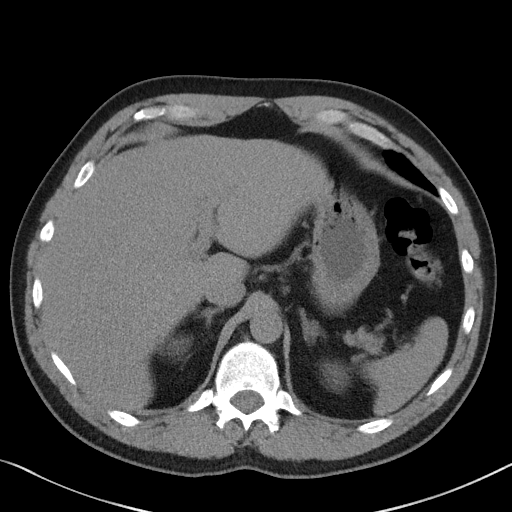
[im 82/94  soft-tissue]
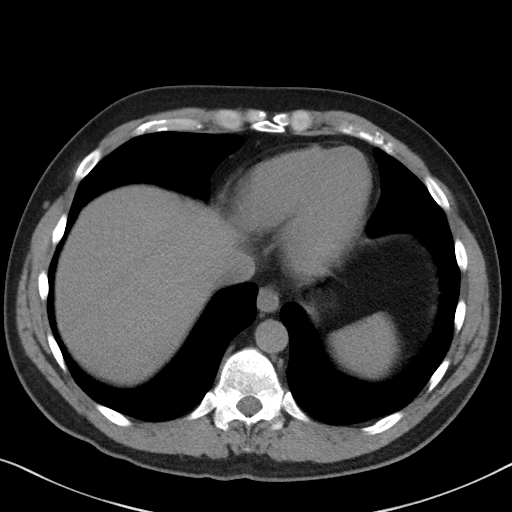
[im 90/94  soft-tissue]
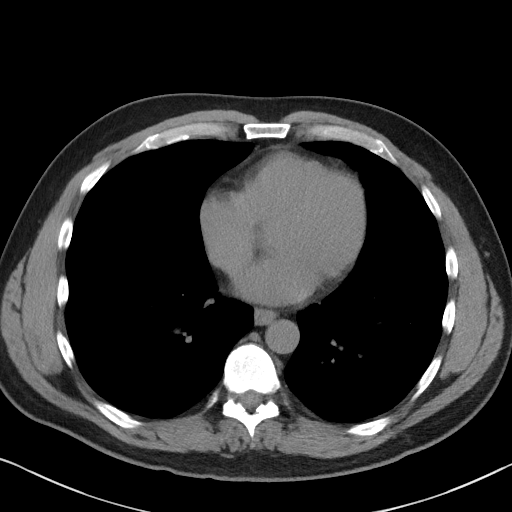

[Series 5: coronal · coronal · 0.78mm/px · 3 of 142 slices shown]
[im 48/142  soft-tissue]
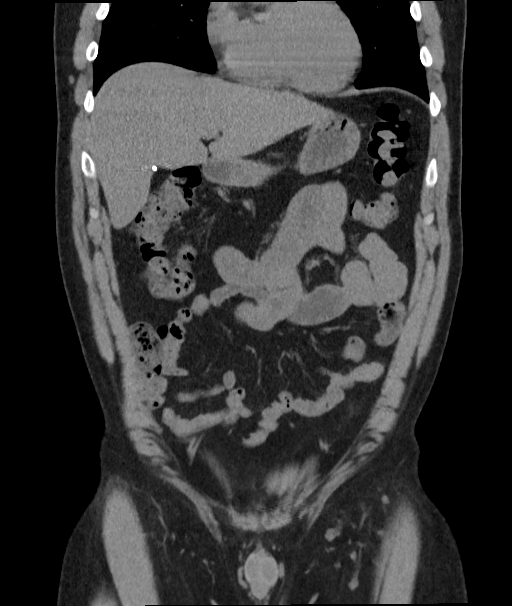
[im 63/142  soft-tissue]
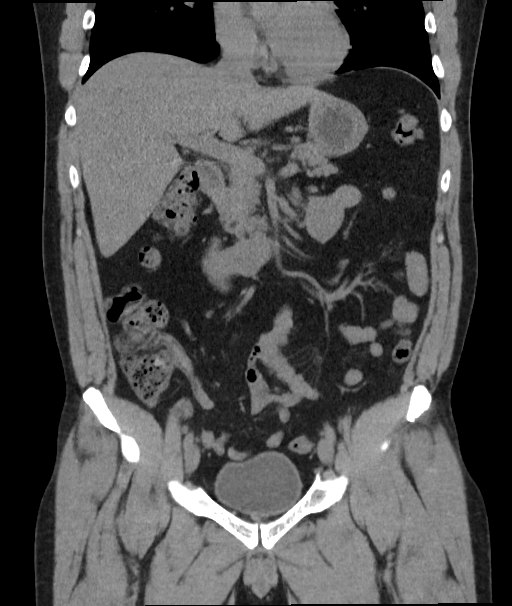
[im 79/142  soft-tissue]
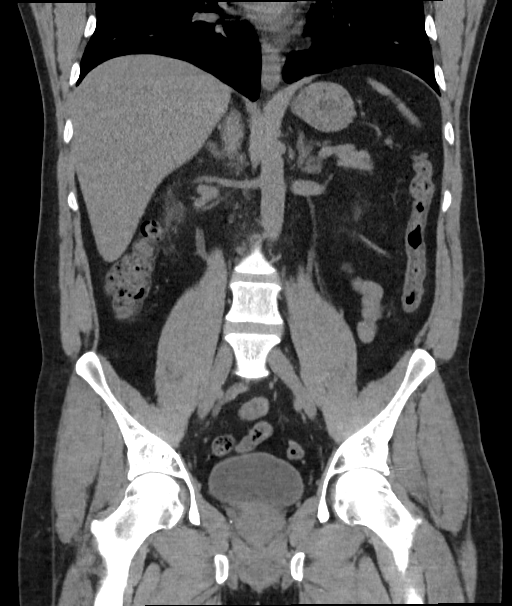

[16 of 46 positions shown; findings below may reference images not displayed]

FINDINGS: LOWER CHEST: The imaged lungs are clear.The visualized heart is
normal in size.

HEPATOBILIARY: No evidence of focal liver lesion.The common duct is
normal in caliber. Prior cholecystectomy.

PANCREAS: No evidence of focal lesion.No ductal dilatation or
peripancreatic edema.

SPLEEN: No evidence of focal lesion.No splenomegaly.

ADRENALS/URINARY TRACT:

1.5 cm left adrenal gland nodule with a mean Hounsfield unit
measurement of 4 HU, findings consistent with adrenal adenoma. No
right adrenal gland mass.

There are multiple small calculi within the right renal collecting
system measuring up to 3 mm. Several punctate calculi are also
present within the left renal collecting system. There is minimal
asymmetric prominence of the right renal pelvis without overt
hydronephrosis. Mild right hydroureter extending to the level of a 3
mm right UVJ calculus (series 2, image 77). The bladder is otherwise
unremarkable for the degree of distension.

STOMACH/BOWEL: The stomach is unremarkable.No dilated loops of bowel
or bowel wall thickening.Normal appendix.

VASCULAR/LYMPHATIC:

No abdominal aortic aneurysm. Minimal scattered calcified plaque
within the abdominal aorta.

No lymphadenopathy.

REPRODUCTIVE: Incompletely assessed by CT modality.No pathologic
findings.

OTHER: No ascites.The body wall is normal.

MUSCULOSKELETAL: No acute bony abnormality.
IMPRESSION: Mild right hydroureter extending to the level of a 3 mm right
ureterovesicular junction calculus. Asymmetric prominence of the
right renal pelvis without overt hydronephrosis.

Additional small calculi within the bilateral kidneys.

Incidentally noted 1.5 cm left adrenal adenoma.

## 2021-03-23 ENCOUNTER — Emergency Department
Admission: EM | Admit: 2021-03-23 | Discharge: 2021-03-23 | Disposition: A | Discharge status: Home / Self Care | Payer: Self-pay | Attending: Emergency Medicine | Admitting: Emergency Medicine

## 2021-03-23 ENCOUNTER — Emergency Department: Payer: Self-pay

## 2021-03-23 ENCOUNTER — Other Ambulatory Visit: Payer: Self-pay

## 2021-03-23 ENCOUNTER — Encounter: Payer: Self-pay | Admitting: Emergency Medicine

## 2021-03-23 DIAGNOSIS — R0789 Other chest pain: Secondary | ICD-10-CM | POA: Insufficient documentation

## 2021-03-23 DIAGNOSIS — F17210 Nicotine dependence, cigarettes, uncomplicated: Secondary | ICD-10-CM | POA: Insufficient documentation

## 2021-03-23 DIAGNOSIS — R071 Chest pain on breathing: Secondary | ICD-10-CM

## 2021-03-23 LAB — CBC
HCT: 44 % (ref 39.0–52.0)
Hemoglobin: 15.7 g/dL (ref 13.0–17.0)
MCH: 33.8 pg (ref 26.0–34.0)
MCHC: 35.7 g/dL (ref 30.0–36.0)
MCV: 94.6 fL (ref 80.0–100.0)
Platelets: 280 10*3/uL (ref 150–400)
RBC: 4.65 MIL/uL (ref 4.22–5.81)
RDW: 13.2 % (ref 11.5–15.5)
WBC: 6.9 10*3/uL (ref 4.0–10.5)
nRBC: 0 % (ref 0.0–0.2)

## 2021-03-23 LAB — BASIC METABOLIC PANEL
Anion gap: 6 (ref 5–15)
BUN: 22 mg/dL — ABNORMAL HIGH (ref 6–20)
CO2: 23 mmol/L (ref 22–32)
Calcium: 9.1 mg/dL (ref 8.9–10.3)
Chloride: 108 mmol/L (ref 98–111)
Creatinine, Ser: 1.35 mg/dL — ABNORMAL HIGH (ref 0.61–1.24)
GFR, Estimated: >60 mL/min (ref >60)
Glucose, Bld: 124 mg/dL — ABNORMAL HIGH (ref 70–99)
Potassium: 3.9 mmol/L (ref 3.5–5.1)
Sodium: 137 mmol/L (ref 135–145)

## 2021-03-23 LAB — D-DIMER, QUANTITATIVE: D-Dimer, Quant: 0.27 ug/mL-FEU (ref 0.00–0.50)

## 2021-03-23 LAB — TROPONIN I (HIGH SENSITIVITY)
Troponin I (High Sensitivity): 3 ng/L (ref <18)
Troponin I (High Sensitivity): 3 ng/L (ref <18)

## 2021-03-23 NOTE — ED Notes (Signed)
See triage note, pt reports centralized cp that started last Thursday radiating to back. States has been heavy lifting. Denies cardiac hx.  +shob. Denies n/v NAD noted

## 2021-03-23 NOTE — ED Notes (Signed)
Lab contacted to add on d-dimer 

## 2021-03-23 NOTE — ED Triage Notes (Signed)
C/O right sided chest pain that radiates through to right upper back since Thursday.  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  Pain worse with movement/ C+DB.  AAOx3.  Skin warm and dry. NAD

## 2021-03-23 NOTE — ED Provider Notes (Signed)
[facility] Emergency Department Provider Note   ____________________________________________   Event Date/Time   First MD Initiated Contact with Patient 03/23/21 1641     (approximate)  I have reviewed the triage vital signs and the nursing notes.   HISTORY  Chief Complaint Chest Pain    HPI Walter Jefferson is a 45 y.o. male presents via private vehicle for chest pain  LOCATION: Right parasternal DURATION: 4 days prior to arrival TIMING: Intermittent but stable since onset SEVERITY: 5/10 QUALITY: Aching CONTEXT: Patient states that he was doing strenuous exercise 4 days prior to arrival and felt a discomfort in his right chest later that night that resolved spontaneously.  Patient states that he has had episodes throughout the day where he feels this pain recur MODIFYING FACTORS: States that stretching and taking a deep breath worsens this pain and is partially relieved at rest ASSOCIATED SYMPTOMS: Denies   Per medical record review patient has no personal history of heart disease however states that his mother passed away of a "heart aneurysm"          Past Medical History:  Diagnosis Date   Cholelithiasis    GERD (gastroesophageal reflux disease)    rare-tums prn   History of kidney stones    h/o    Patient Active Problem List   Diagnosis Date Noted   Nephrolithiasis 08/25/2019   Adenoma of left adrenal gland 83/15/1761   Biliary colic    Umbilical hernia without obstruction and without gangrene    Acute bacterial sinusitis 02/09/2013    Past Surgical History:  Procedure Laterality Date   CHOLECYSTECTOMY N/A 04/19/2018   Procedure: LAPAROSCOPIC CHOLECYSTECTOMY WITH INTRAOPERATIVE CHOLANGIOGRAM;  Surgeon: Florene Glen, MD;  Location: [facility] ORS;  Service: General;  Laterality: N/A;   UMBILICAL HERNIA REPAIR N/A 04/19/2018   Procedure: HERNIA REPAIR UMBILICAL ADULT;  Surgeon: Florene Glen, MD;  Location: [facility] ORS;   Service: General;  Laterality: N/A;   WISDOM TOOTH EXTRACTION      Prior to Admission medications   Medication Sig Start Date End Date Taking? Authorizing Provider  fluticasone (FLONASE) 50 MCG/ACT nasal spray Place 1-2 sprays into both nostrils daily as needed for allergies.    [provider]  ibuprofen (ADVIL,MOTRIN) 200 MG tablet Take 400 mg by mouth every 8 (eight) hours as needed (for pain.).    [provider]  loratadine (CLARITIN) 10 MG tablet Take 10 mg by mouth every morning.     [provider]  Multiple Vitamin (MULTIVITAMIN WITH MINERALS) TABS tablet Take 1 tablet by mouth daily.    [provider]  ondansetron (ZOFRAN ODT) 4 MG disintegrating tablet Take 1 tablet (4 mg total) by mouth every 8 (eight) hours as needed for nausea or vomiting. 08/24/19   Vanessa [place], MD  ondansetron (ZOFRAN) 4 MG tablet Take 4 mg by mouth daily as needed for nausea.    [provider]    Allergies Penicillins  Family History  Problem Relation Age of Onset   Hyperlipidemia Father    Heart disease Paternal Uncle        heart transplant   Cancer Paternal Uncle        kidney   Diabetes Neg Hx    Stroke Neg Hx    CAD Neg Hx     Social History Social History   Tobacco Use   Smoking status: Every Day    Packs/day: 0.25    Years: 20.00  Pack years: 5.00    Types: Cigarettes   Smokeless tobacco: Never  Vaping Use   Vaping Use: Never used  Substance Use Topics   Alcohol use: Yes    Alcohol/week: 6.0 standard drinks    Types: 6 Cans of beer per week   Drug use: No    Review of Systems Constitutional: No fever/chills Eyes: No visual changes. ENT: No sore throat. Cardiovascular: Endorses chest pain. Respiratory: Denies shortness of breath. Gastrointestinal: No abdominal pain.  No nausea, no vomiting.  No diarrhea. Genitourinary: Negative for dysuria. Musculoskeletal: Negative for acute arthralgias Skin: Negative for  rash. Neurological: Negative for headaches, weakness/numbness/paresthesias in any extremity Psychiatric: Negative for suicidal ideation/homicidal ideation   ____________________________________________   PHYSICAL EXAM:  VITAL SIGNS: ED Triage Vitals  Enc Vitals Group     BP 03/23/21 1501 (!) 159/109     Pulse Rate 03/23/21 1501 (!) 108     Resp 03/23/21 1501 15     Temp 03/23/21 1501 98.6 F (37 C)     Temp Source 03/23/21 1501 Oral     SpO2 03/23/21 1501 98 %     Weight 03/23/21 1458 184 lb 15.5 oz (83.9 kg)     Height 03/23/21 1458 5\' 10"  (1.778 m)     Head Circumference --      Peak Flow --      Pain Score 03/23/21 1457 5     Pain Loc --      Pain Edu? --      Excl. in [place]? --    Constitutional: Alert and oriented. Well appearing and in no acute distress. Eyes: Conjunctivae are normal. PERRL. Head: Atraumatic. Nose: No congestion/rhinnorhea. Mouth/Throat: Mucous membranes are moist. Neck: No stridor Cardiovascular: Grossly normal heart sounds.  Good peripheral circulation. Respiratory: Normal respiratory effort.  No retractions. Gastrointestinal: Soft and nontender. No distention. Musculoskeletal: No obvious deformities.  Tender to palpation over the right parasternal border Neurologic:  Normal speech and language. No gross focal neurologic deficits are appreciated. Skin:  Skin is warm and dry. No rash noted. Psychiatric: Mood and affect are normal. Speech and behavior are normal.  ____________________________________________   LABS (all labs ordered are listed, but only abnormal results are displayed)  Labs Reviewed  BASIC METABOLIC PANEL - Abnormal; Notable for the following components:      Result Value   Glucose, Bld 124 (*)    BUN 22 (*)    Creatinine, Ser 1.35 (*)    All other components within normal limits  CBC  D-DIMER, QUANTITATIVE  TROPONIN I (HIGH SENSITIVITY)  TROPONIN I (HIGH SENSITIVITY)    ____________________________________________  EKG  ED ECG REPORT I, Naaman Plummer, the attending physician, personally viewed and interpreted this ECG.  Date: 03/23/2021 EKG Time: 1505 Rate: 110 Rhythm: Tachycardic sinus rhythm QRS Axis: normal Intervals: normal ST/T Wave abnormalities: normal Narrative Interpretation: no evidence of acute ischemia  ____________________________________________  RADIOLOGY  ED MD interpretation: 2 view chest x-ray shows no evidence of acute abnormalities including no pneumonia, pneumothorax, or widened mediastinum  Official radiology report(s): DG Chest 2 View  Result Date: 03/23/2021 CLINICAL DATA:  RIGHT-sided chest pain EXAM: CHEST - 2 VIEW COMPARISON:  None. FINDINGS: Normal mediastinum and cardiac silhouette. Normal pulmonary vasculature. No evidence of effusion, infiltrate, or pneumothorax. No acute bony abnormality. IMPRESSION: No acute cardiopulmonary process. Electronically Signed   By: Suzy Bouchard M.D.   On: 03/23/2021 16:02    ____________________________________________   PROCEDURES  Procedure(s) performed (including Critical Care):  Marland Kitchen  1-3 Lead EKG Interpretation  Date/Time: 03/23/2021 6:42 PM Performed by: Naaman Plummer, MD Authorized by: Naaman Plummer, MD     Interpretation: normal     ECG rate:  74   ECG rate assessment: normal     Rhythm: sinus rhythm     Ectopy: none     Conduction: normal     ____________________________________________   INITIAL IMPRESSION / ASSESSMENT AND PLAN / ED COURSE  As part of my medical decision making, I reviewed the following data within the electronic medical record, if available:  Nursing notes reviewed and incorporated, Labs reviewed, EKG interpreted, Old chart reviewed, Radiograph reviewed and Notes from prior ED visits reviewed and incorporated      Workup: ECG, CXR, CBC, BMP, Troponin Findings: ECG: No overt evidence of STEMI. No evidence of Brugadas sign, delta  wave, epsilon wave, significantly prolonged QTc, or malignant arrhythmia HS Troponin: Negative x1 Other Labs unremarkable for emergent problems. CXR: Without PTX, PNA, or widened mediastinum Last Stress Test: Never Last Heart Catheterization: Never HEART Score: 2  Given History, Exam, and Workup I have low suspicion for ACS, Pneumothorax, Pneumonia, Pulmonary Embolus, Tamponade, Aortic Dissection or other emergent problem as a cause for this presentation.   Reassesment: Prior to discharge patients pain was controlled and they were well appearing.  Disposition:  Discharge. Strict return precautions discussed with patient with full understanding. Advised patient to follow up promptly with primary care provider      ____________________________________________   FINAL CLINICAL IMPRESSION(S) / ED DIAGNOSES  Final diagnoses:  Chest pain on breathing     ED Discharge Orders     None        Note:  This document was prepared using Dragon voice recognition software and may include unintentional dictation errors.    Naaman Plummer, MD 03/23/21 [phone]

## 2022-12-12 ENCOUNTER — Emergency Department: Payer: Managed Care, Other (non HMO)

## 2022-12-12 ENCOUNTER — Emergency Department
Admission: EM | Admit: 2022-12-12 | Discharge: 2022-12-12 | Disposition: A | Discharge status: Home / Self Care | Payer: Managed Care, Other (non HMO) | Attending: Emergency Medicine | Admitting: Emergency Medicine

## 2022-12-12 ENCOUNTER — Other Ambulatory Visit: Payer: Self-pay

## 2022-12-12 DIAGNOSIS — R109 Unspecified abdominal pain: Secondary | ICD-10-CM

## 2022-12-12 DIAGNOSIS — N23 Unspecified renal colic: Secondary | ICD-10-CM | POA: Insufficient documentation

## 2022-12-12 DIAGNOSIS — N201 Calculus of ureter: Secondary | ICD-10-CM | POA: Diagnosis not present

## 2022-12-12 LAB — BASIC METABOLIC PANEL
Anion gap: 8 (ref 5–15)
BUN: 23 mg/dL — ABNORMAL HIGH (ref 6–20)
CO2: 25 mmol/L (ref 22–32)
Calcium: 8.9 mg/dL (ref 8.9–10.3)
Chloride: 102 mmol/L (ref 98–111)
Creatinine, Ser: 1.5 mg/dL — ABNORMAL HIGH (ref 0.61–1.24)
GFR, Estimated: 58 mL/min — ABNORMAL LOW (ref >60)
Glucose, Bld: 126 mg/dL — ABNORMAL HIGH (ref 70–99)
Potassium: 3.4 mmol/L — ABNORMAL LOW (ref 3.5–5.1)
Sodium: 135 mmol/L (ref 135–145)

## 2022-12-12 LAB — URINALYSIS, ROUTINE W REFLEX MICROSCOPIC
Bilirubin Urine: NEGATIVE
Glucose, UA: NEGATIVE mg/dL
Ketones, ur: NEGATIVE mg/dL
Leukocytes,Ua: NEGATIVE
Nitrite: NEGATIVE
Protein, ur: 30 mg/dL — AB
Specific Gravity, Urine: 1.024 (ref 1.005–1.030)
pH: 5 (ref 5.0–8.0)

## 2022-12-12 LAB — CBC
HCT: 44.6 % (ref 39.0–52.0)
Hemoglobin: 15.6 g/dL (ref 13.0–17.0)
MCH: 34.1 pg — ABNORMAL HIGH (ref 26.0–34.0)
MCHC: 35 g/dL (ref 30.0–36.0)
MCV: 97.4 fL (ref 80.0–100.0)
Platelets: 305 10*3/uL (ref 150–400)
RBC: 4.58 MIL/uL (ref 4.22–5.81)
RDW: 12.5 % (ref 11.5–15.5)
WBC: 10.2 10*3/uL (ref 4.0–10.5)
nRBC: 0 % (ref 0.0–0.2)

## 2022-12-12 MED ORDER — KETOROLAC TROMETHAMINE 30 MG/ML IJ SOLN
15.0000 mg | Freq: Once | INTRAMUSCULAR | Status: AC
  Administered 2022-12-12: 15 mg via INTRAVENOUS
  Filled 2022-12-12: qty 1

## 2022-12-12 MED ORDER — LACTATED RINGERS IV BOLUS
1000.0000 mL | Freq: Once | INTRAVENOUS | Status: AC
  Administered 2022-12-12: 1000 mL via INTRAVENOUS

## 2022-12-12 NOTE — ED Provider Notes (Signed)
[facility] Provider Note    Event Date/Time   First MD Initiated Contact with Patient 12/12/22 0143     (approximate)   History   Flank Pain (LEFT x 30 mins)   HPI  Walter Jefferson is a 47 y.o. male who presents to the ED for evaluation of Flank Pain (LEFT x 30 mins)   Patient is a history of kidney stones presents with left-sided flank discomfort just in the past hour or so consistent with previous kidney stones in the past.  No recent or preceding illnesses prior to this.  Pain has migrated from his flank down down towards his groin     Physical Exam   Triage Vital Signs: ED Triage Vitals [12/12/22 0138]  Enc Vitals Group     BP (!) 187/108     Pulse Rate 95     Resp 16     Temp 97.9 F (36.6 C)     Temp Source Oral     SpO2 98 %     Weight 195 lb (88.5 kg)     Height 5\' 10"  (1.778 m)     Head Circumference      Peak Flow      Pain Score 6     Pain Loc      Pain Edu?      Excl. in [place]?     Most recent vital signs: Vitals:   12/12/22 0300 12/12/22 0330  BP: (!) 153/92 (!) 155/98  Pulse: 72 77  Resp: 18 16  Temp:    SpO2: 98% 97%    General: Awake, no distress.  CV:  Good peripheral perfusion.  Resp:  Normal effort.  Abd:  No distention.  Soft MSK:  No deformity noted.  Neuro:  No focal deficits appreciated. Other:     ED Results / Procedures / Treatments   Labs (all labs ordered are listed, but only abnormal results are displayed) Labs Reviewed  URINALYSIS, ROUTINE W REFLEX MICROSCOPIC - Abnormal; Notable for the following components:      Result Value   Color, Urine YELLOW (*)    APPearance HAZY (*)    Hgb urine dipstick SMALL (*)    Protein, ur 30 (*)    All other components within normal limits  BASIC METABOLIC PANEL - Abnormal; Notable for the following components:   Potassium 3.4 (*)    Glucose, Bld 126 (*)    BUN 23 (*)    Creatinine, Ser 1.50 (*)    GFR, Estimated 58 (*)    All other components  within normal limits  CBC - Abnormal; Notable for the following components:   MCH 34.1 (*)    All other components within normal limits    EKG   RADIOLOGY CT renal study interpreted by me with small distal left-sided ureteral stone  Official radiology report(s): CT Renal Stone Study  Result Date: 12/12/2022 CLINICAL DATA:  Abdominal pain. EXAM: CT ABDOMEN AND PELVIS WITHOUT CONTRAST TECHNIQUE: Multidetector CT imaging of the abdomen and pelvis was performed following the standard protocol without IV contrast. RADIATION DOSE REDUCTION: This exam was performed according to the departmental dose-optimization program which includes automated exposure control, adjustment of the mA and/or kV according to patient size and/or use of iterative reconstruction technique. COMPARISON:  CT abdomen pelvis dated 08/24/2019. FINDINGS: Evaluation of this exam is limited in the absence of intravenous contrast. Lower chest: The visualized lung bases are clear. No intra-abdominal free air or free fluid.  Hepatobiliary: Fatty liver. No biliary dilatation. Cholecystectomy. No retained calcified stone noted in the central CBD. Pancreas: Unremarkable. No pancreatic ductal dilatation or surrounding inflammatory changes. Spleen: Normal in size without focal abnormality. Adrenals/Urinary Tract: The adrenal glands are unremarkable. There is a 2 mm stone in the distal left ureter adjacent to the ureterovesical junction. There is mild left hydronephrosis. Additional punctate nonobstructing left renal inferior pole calculus. The right kidney, and the right ureter appear unremarkable. The urinary bladder is collapsed. Stomach/Bowel: There is no bowel obstruction or active inflammation. Thickened appearance of the colon related to underdistention. The appendix is normal. Vascular/Lymphatic: None multi aortoiliac atherosclerotic disease. The IVC is unremarkable. No portal venous gas. There is no adenopathy. Reproductive: The prostate  and seminal vesicles are grossly unremarkable. No pelvic mass. Other: Small fat containing midline supraumbilical hernia. Musculoskeletal: No acute or significant osseous findings. IMPRESSION: 1. A 2 mm distal left ureteral stone with mild left hydronephrosis. Additional punctate nonobstructing left renal inferior pole calculus. 2. Fatty liver. 3. No bowel obstruction. Normal appendix. 4.  Aortic Atherosclerosis (ICD10-I70.0). Electronically Signed   By: Anner Crete M.D.   On: 12/12/2022 02:14    PROCEDURES and INTERVENTIONS:  Procedures  Medications  lactated ringers bolus 1,000 mL (0 mLs Intravenous Stopped 12/12/22 0249)  ketorolac (TORADOL) 30 MG/ML injection 15 mg (15 mg Intravenous Given 12/12/22 0211)     IMPRESSION / MDM / ASSESSMENT AND PLAN / ED COURSE  I reviewed the triage vital signs and the nursing notes.  Differential diagnosis includes, but is not limited to, ureteral colic, ureteral obstruction, pyelonephritis, UTI, diverticulitis  {Patient presents with symptoms of an acute illness or injury that is potentially life-threatening.  47 year old male presents with recurrence of ureteral colic from a small distal ureteral stone, likely passing in the ED and suitable for outpatient management.  Blood work with CKD around baseline.  Normal CBC.  Urine without infectious features.  CT with 2 mm distal stone.  Pain resolved with Toradol and fluids and he is suitable for outpatient management  Clinical Course as of 12/12/22 0334  Sun Dec 12, 2022  0328 Reassessed.  Resolution of pain.  Discussed small distal ureteral stone and likely passing it.  Discussed expectant management and return precautions. [DS]    Clinical Course User Index [DS] Vladimir Crofts, MD     FINAL CLINICAL IMPRESSION(S) / ED DIAGNOSES   Final diagnoses:  Ureteral colic  Left flank pain  Ureterolithiasis     Rx / DC Orders   ED Discharge Orders     None        Note:  This document was  prepared using Dragon voice recognition software and may include unintentional dictation errors.   Vladimir Crofts, MD 12/12/22 [phone]

## 2022-12-12 NOTE — ED Notes (Signed)
ED Provider at bedside. 

## 2022-12-12 NOTE — ED Triage Notes (Signed)
Pt to ED from home for left sided flank pain x 30 minutes. Pt has previous HX of kidney stones and advised this feels the same. Pt is CaoX4 and in no acute distress, ambulatory in triage.

## 2023-07-09 ENCOUNTER — Telehealth: Payer: Managed Care, Other (non HMO) | Admitting: Physician Assistant

## 2023-07-09 DIAGNOSIS — J329 Chronic sinusitis, unspecified: Secondary | ICD-10-CM

## 2023-07-09 MED ORDER — DOXYCYCLINE HYCLATE 100 MG PO TABS: 100.0000 mg | ORAL_TABLET | Freq: Two times a day (BID) | ORAL | 0 refills | Status: DC

## 2023-07-09 NOTE — Progress Notes (Signed)
E-Visit for Sinus Problems  We are sorry that you are not feeling well.  Here is how we plan to help!  Based on what you have shared with me it looks like you have sinusitis.  Sinusitis is inflammation and infection in the sinus cavities of the head.  Based on your presentation I believe you most likely have Acute Viral Sinusitis.This is an infection most likely caused by a virus. There is not specific treatment for viral sinusitis other than to help you with the symptoms until the infection runs its course.  You may use an oral decongestant such as Sudafed(will need to ask the pharmacist) or Mucinex-D(which is stored behind the pharmacy counter) or if you have glaucoma or high blood pressure use plain Mucinex. You can also try Neti pot, steam from long hot shower, Saline nasal spray can also help and can safely be used as often as needed for congestion. In case your symptoms don't improve over the next 3-4 days with the above treatment plan then consider an oral antibiotic, Doxcycycline 100mg  twice daily x 7 days.   Some authorities believe that zinc sprays or the use of Echinacea may shorten the course of your symptoms.  Sinus infections are not as easily transmitted as other respiratory infection, however we still recommend that you avoid close contact with loved ones, especially the very young and elderly.  Remember to wash your hands thoroughly throughout the day as this is the number one way to prevent the spread of infection!  Home Care: Only take medications as instructed by your medical team. Do not take these medications with alcohol. A steam or ultrasonic humidifier can help congestion.  You can place a towel over your head and breathe in the steam from hot water coming from a faucet. Avoid close contacts especially the very young and the elderly. Cover your mouth when you cough or sneeze. Always remember to wash your hands.  Get Help Right Away If: You develop worsening fever or sinus  pain. You develop a severe head ache or visual changes. Your symptoms persist after you have completed your treatment plan.  Make sure you Understand these instructions. Will watch your condition. Will get help right away if you are not doing well or get worse.   Thank you for choosing an e-visit.  Your e-visit answers were reviewed by a board certified advanced clinical practitioner to complete your personal care plan. Depending upon the condition, your plan could have included both over the counter or prescription medications.  Please review your pharmacy choice. Make sure the pharmacy is open so you can pick up prescription now. If there is a problem, you may contact your provider through [employer] and have the prescription routed to another pharmacy.  Your safety is important to [place]. If you have drug allergies check your prescription carefully.   For the next 24 hours you can use MyChart to ask questions about today's visit, request a non-urgent call back, or ask for a work or school excuse. You will get an email in the next two days asking about your experience. I hope that your e-visit has been valuable and will speed your recovery.  I have spent 5 minutes in review of e-visit questionnaire, review and updating patient chart, medical decision making and response to patient.   Gilberto Better, PA-C

## 2024-04-05 ENCOUNTER — Telehealth: Admitting: Physician Assistant

## 2024-04-05 DIAGNOSIS — J019 Acute sinusitis, unspecified: Secondary | ICD-10-CM

## 2024-04-05 DIAGNOSIS — Z8709 Personal history of other diseases of the respiratory system: Secondary | ICD-10-CM

## 2024-04-05 DIAGNOSIS — B9689 Other specified bacterial agents as the cause of diseases classified elsewhere: Secondary | ICD-10-CM

## 2024-04-05 MED ORDER — DOXYCYCLINE HYCLATE 100 MG PO TABS: 100.0000 mg | ORAL_TABLET | Freq: Two times a day (BID) | ORAL | 0 refills | Status: AC

## 2024-04-05 NOTE — Progress Notes (Signed)
 Message sent to patient requesting further input regarding current symptoms. Awaiting patient response.

## 2024-04-05 NOTE — Progress Notes (Signed)
 I have spent 5 minutes in review of e-visit questionnaire, review and updating patient chart, medical decision making and response to patient.   Piedad Climes, PA-C

## 2024-04-05 NOTE — Progress Notes (Signed)
